# Patient Record
Sex: Male | Born: 1960 | Race: Black or African American | Hispanic: No | Marital: Married | Smoking: Never smoker
Health system: Southern US, Community
[De-identification: ages and names within clinical notes are randomized; demographics above are authoritative.]

## PROBLEM LIST (undated history)

## (undated) DIAGNOSIS — M1990 Unspecified osteoarthritis, unspecified site: Secondary | ICD-10-CM

## (undated) DIAGNOSIS — R011 Cardiac murmur, unspecified: Secondary | ICD-10-CM

## (undated) DIAGNOSIS — K644 Residual hemorrhoidal skin tags: Secondary | ICD-10-CM

## (undated) DIAGNOSIS — I10 Essential (primary) hypertension: Secondary | ICD-10-CM

## (undated) DIAGNOSIS — Z9109 Other allergy status, other than to drugs and biological substances: Secondary | ICD-10-CM

## (undated) HISTORY — DX: Cardiac murmur, unspecified: R01.1

---

## 2002-10-13 ENCOUNTER — Emergency Department (HOSPITAL_COMMUNITY): Admission: EM | Admit: 2002-10-13 | Discharge: 2002-10-13 | Payer: Self-pay | Admitting: Emergency Medicine

## 2002-10-13 ENCOUNTER — Encounter: Payer: Self-pay | Admitting: Emergency Medicine

## 2006-07-10 ENCOUNTER — Ambulatory Visit (HOSPITAL_COMMUNITY): Admission: RE | Admit: 2006-07-10 | Discharge: 2006-07-10 | Payer: Self-pay | Admitting: Orthopedic Surgery

## 2006-07-10 HISTORY — PX: KNEE ARTHROSCOPY W/ MENISCECTOMY: SHX1879

## 2009-08-20 ENCOUNTER — Encounter (INDEPENDENT_AMBULATORY_CARE_PROVIDER_SITE_OTHER): Payer: Self-pay

## 2009-08-20 ENCOUNTER — Inpatient Hospital Stay (HOSPITAL_COMMUNITY): Admission: EM | Admit: 2009-08-20 | Discharge: 2009-09-10 | Payer: Self-pay | Admitting: Emergency Medicine

## 2009-08-20 HISTORY — PX: APPENDECTOMY: SHX54

## 2009-08-28 ENCOUNTER — Encounter (INDEPENDENT_AMBULATORY_CARE_PROVIDER_SITE_OTHER): Payer: Self-pay

## 2009-08-28 HISTORY — PX: OTHER SURGICAL HISTORY: SHX169

## 2010-09-06 LAB — GLUCOSE, CAPILLARY
Glucose-Capillary: 125 mg/dL — ABNORMAL HIGH (ref 70–99)
Glucose-Capillary: 126 mg/dL — ABNORMAL HIGH (ref 70–99)
Glucose-Capillary: 136 mg/dL — ABNORMAL HIGH (ref 70–99)
Glucose-Capillary: 142 mg/dL — ABNORMAL HIGH (ref 70–99)
Glucose-Capillary: 144 mg/dL — ABNORMAL HIGH (ref 70–99)
Glucose-Capillary: 146 mg/dL — ABNORMAL HIGH (ref 70–99)
Glucose-Capillary: 160 mg/dL — ABNORMAL HIGH (ref 70–99)
Glucose-Capillary: 171 mg/dL — ABNORMAL HIGH (ref 70–99)
Glucose-Capillary: 189 mg/dL — ABNORMAL HIGH (ref 70–99)

## 2010-09-06 LAB — CBC
HCT: 32.1 % — ABNORMAL LOW (ref 39.0–52.0)
HCT: 36.1 % — ABNORMAL LOW (ref 39.0–52.0)
HCT: 37 % — ABNORMAL LOW (ref 39.0–52.0)
HCT: 40.1 % (ref 39.0–52.0)
HCT: 45.8 % (ref 39.0–52.0)
Hemoglobin: 10.4 g/dL — ABNORMAL LOW (ref 13.0–17.0)
Hemoglobin: 10.5 g/dL — ABNORMAL LOW (ref 13.0–17.0)
Hemoglobin: 10.7 g/dL — ABNORMAL LOW (ref 13.0–17.0)
Hemoglobin: 10.7 g/dL — ABNORMAL LOW (ref 13.0–17.0)
Hemoglobin: 10.7 g/dL — ABNORMAL LOW (ref 13.0–17.0)
Hemoglobin: 12.4 g/dL — ABNORMAL LOW (ref 13.0–17.0)
Hemoglobin: 13.4 g/dL (ref 13.0–17.0)
Hemoglobin: 15.3 g/dL (ref 13.0–17.0)
MCHC: 33.2 g/dL (ref 30.0–36.0)
MCHC: 33.3 g/dL (ref 30.0–36.0)
MCHC: 33.5 g/dL (ref 30.0–36.0)
MCHC: 33.6 g/dL (ref 30.0–36.0)
MCHC: 33.7 g/dL (ref 30.0–36.0)
MCHC: 33.8 g/dL (ref 30.0–36.0)
MCHC: 33.8 g/dL (ref 30.0–36.0)
MCHC: 34 g/dL (ref 30.0–36.0)
MCHC: 34.2 g/dL (ref 30.0–36.0)
MCHC: 34.5 g/dL (ref 30.0–36.0)
MCV: 87.1 fL (ref 78.0–100.0)
MCV: 88.1 fL (ref 78.0–100.0)
MCV: 88.2 fL (ref 78.0–100.0)
MCV: 88.3 fL (ref 78.0–100.0)
MCV: 88.7 fL (ref 78.0–100.0)
MCV: 89 fL (ref 78.0–100.0)
MCV: 89.2 fL (ref 78.0–100.0)
MCV: 89.2 fL (ref 78.0–100.0)
Platelets: 320 10*3/uL (ref 150–400)
Platelets: 324 10*3/uL (ref 150–400)
Platelets: 343 10*3/uL (ref 150–400)
Platelets: 349 10*3/uL (ref 150–400)
Platelets: 435 10*3/uL — ABNORMAL HIGH (ref 150–400)
Platelets: 542 10*3/uL — ABNORMAL HIGH (ref 150–400)
Platelets: 591 10*3/uL — ABNORMAL HIGH (ref 150–400)
Platelets: 609 10*3/uL — ABNORMAL HIGH (ref 150–400)
Platelets: 639 10*3/uL — ABNORMAL HIGH (ref 150–400)
RBC: 3.29 MIL/uL — ABNORMAL LOW (ref 4.22–5.81)
RBC: 3.49 MIL/uL — ABNORMAL LOW (ref 4.22–5.81)
RBC: 3.56 MIL/uL — ABNORMAL LOW (ref 4.22–5.81)
RBC: 4.17 MIL/uL — ABNORMAL LOW (ref 4.22–5.81)
RBC: 4.58 MIL/uL (ref 4.22–5.81)
RBC: 5.2 MIL/uL (ref 4.22–5.81)
RDW: 15.5 % (ref 11.5–15.5)
RDW: 15.8 % — ABNORMAL HIGH (ref 11.5–15.5)
RDW: 15.9 % — ABNORMAL HIGH (ref 11.5–15.5)
RDW: 16 % — ABNORMAL HIGH (ref 11.5–15.5)
RDW: 16.2 % — ABNORMAL HIGH (ref 11.5–15.5)
RDW: 16.2 % — ABNORMAL HIGH (ref 11.5–15.5)
RDW: 16.3 % — ABNORMAL HIGH (ref 11.5–15.5)
RDW: 16.3 % — ABNORMAL HIGH (ref 11.5–15.5)
RDW: 16.5 % — ABNORMAL HIGH (ref 11.5–15.5)
WBC: 10.6 10*3/uL — ABNORMAL HIGH (ref 4.0–10.5)
WBC: 10.9 10*3/uL — ABNORMAL HIGH (ref 4.0–10.5)
WBC: 11.2 10*3/uL — ABNORMAL HIGH (ref 4.0–10.5)
WBC: 11.4 10*3/uL — ABNORMAL HIGH (ref 4.0–10.5)
WBC: 7.8 10*3/uL (ref 4.0–10.5)
WBC: 9.6 10*3/uL (ref 4.0–10.5)

## 2010-09-06 LAB — COMPREHENSIVE METABOLIC PANEL
ALT: 22 U/L (ref 0–53)
AST: 24 U/L (ref 0–37)
AST: 37 U/L (ref 0–37)
AST: 49 U/L — ABNORMAL HIGH (ref 0–37)
Albumin: 2 g/dL — ABNORMAL LOW (ref 3.5–5.2)
Albumin: 2.3 g/dL — ABNORMAL LOW (ref 3.5–5.2)
Alkaline Phosphatase: 55 U/L (ref 39–117)
BUN: 12 mg/dL (ref 6–23)
BUN: 8 mg/dL (ref 6–23)
CO2: 23 mEq/L (ref 19–32)
Calcium: 7.7 mg/dL — ABNORMAL LOW (ref 8.4–10.5)
Calcium: 9.9 mg/dL (ref 8.4–10.5)
Chloride: 101 mEq/L (ref 96–112)
Chloride: 110 mEq/L (ref 96–112)
Creatinine, Ser: 0.75 mg/dL (ref 0.4–1.5)
Creatinine, Ser: 0.82 mg/dL (ref 0.4–1.5)
Creatinine, Ser: 1.1 mg/dL (ref 0.4–1.5)
Creatinine, Ser: 1.14 mg/dL (ref 0.4–1.5)
GFR calc Af Amer: >60 mL/min (ref >60)
GFR calc Af Amer: >60 mL/min (ref >60)
GFR calc non Af Amer: >60 mL/min (ref >60)
GFR calc non Af Amer: >60 mL/min (ref >60)
Glucose, Bld: 112 mg/dL — ABNORMAL HIGH (ref 70–99)
Potassium: 3.9 mEq/L (ref 3.5–5.1)
Potassium: 4.4 mEq/L (ref 3.5–5.1)
Total Bilirubin: 0.5 mg/dL (ref 0.3–1.2)
Total Bilirubin: 0.8 mg/dL (ref 0.3–1.2)
Total Protein: 5.4 g/dL — ABNORMAL LOW (ref 6.0–8.3)
Total Protein: 6.7 g/dL (ref 6.0–8.3)
Total Protein: 7.6 g/dL (ref 6.0–8.3)

## 2010-09-06 LAB — TYPE AND SCREEN
ABO/RH(D): A NEG
Antibody Screen: NEGATIVE

## 2010-09-06 LAB — BASIC METABOLIC PANEL
BUN: 10 mg/dL (ref 6–23)
BUN: 10 mg/dL (ref 6–23)
BUN: 15 mg/dL (ref 6–23)
BUN: 7 mg/dL (ref 6–23)
BUN: 9 mg/dL (ref 6–23)
CO2: 22 mEq/L (ref 19–32)
CO2: 24 mEq/L (ref 19–32)
CO2: 24 mEq/L (ref 19–32)
CO2: 25 mEq/L (ref 19–32)
CO2: 29 mEq/L (ref 19–32)
CO2: 32 mEq/L (ref 19–32)
Calcium: 7.6 mg/dL — ABNORMAL LOW (ref 8.4–10.5)
Calcium: 7.9 mg/dL — ABNORMAL LOW (ref 8.4–10.5)
Calcium: 8.8 mg/dL (ref 8.4–10.5)
Calcium: 8.8 mg/dL (ref 8.4–10.5)
Calcium: 9 mg/dL (ref 8.4–10.5)
Chloride: 106 mEq/L (ref 96–112)
Chloride: 108 mEq/L (ref 96–112)
Chloride: 96 mEq/L (ref 96–112)
Chloride: 97 mEq/L (ref 96–112)
Creatinine, Ser: 0.7 mg/dL (ref 0.4–1.5)
Creatinine, Ser: 0.95 mg/dL (ref 0.4–1.5)
Creatinine, Ser: 1.14 mg/dL (ref 0.4–1.5)
Creatinine, Ser: 1.14 mg/dL (ref 0.4–1.5)
Creatinine, Ser: 1.14 mg/dL (ref 0.4–1.5)
Creatinine, Ser: 1.15 mg/dL (ref 0.4–1.5)
Creatinine, Ser: 1.22 mg/dL (ref 0.4–1.5)
GFR calc Af Amer: >60 mL/min (ref >60)
GFR calc Af Amer: >60 mL/min (ref >60)
GFR calc Af Amer: >60 mL/min (ref >60)
GFR calc Af Amer: >60 mL/min (ref >60)
GFR calc Af Amer: >60 mL/min (ref >60)
GFR calc Af Amer: >60 mL/min (ref >60)
GFR calc non Af Amer: >60 mL/min (ref >60)
GFR calc non Af Amer: >60 mL/min (ref >60)
GFR calc non Af Amer: >60 mL/min (ref >60)
GFR calc non Af Amer: >60 mL/min (ref >60)
GFR calc non Af Amer: >60 mL/min (ref >60)
GFR calc non Af Amer: >60 mL/min (ref >60)
Glucose, Bld: 131 mg/dL — ABNORMAL HIGH (ref 70–99)
Glucose, Bld: 132 mg/dL — ABNORMAL HIGH (ref 70–99)
Glucose, Bld: 145 mg/dL — ABNORMAL HIGH (ref 70–99)
Glucose, Bld: 149 mg/dL — ABNORMAL HIGH (ref 70–99)
Potassium: 3.1 mEq/L — ABNORMAL LOW (ref 3.5–5.1)
Potassium: 3.8 mEq/L (ref 3.5–5.1)
Potassium: 3.8 mEq/L (ref 3.5–5.1)
Potassium: 3.9 mEq/L (ref 3.5–5.1)
Sodium: 136 mEq/L (ref 135–145)
Sodium: 137 mEq/L (ref 135–145)
Sodium: 140 mEq/L (ref 135–145)
Sodium: 141 mEq/L (ref 135–145)
Sodium: 143 mEq/L (ref 135–145)

## 2010-09-06 LAB — DIFFERENTIAL
Basophils Absolute: 0 10*3/uL (ref 0.0–0.1)
Basophils Absolute: 0 10*3/uL (ref 0.0–0.1)
Basophils Relative: 0 % (ref 0–1)
Eosinophils Absolute: 0 10*3/uL (ref 0.0–0.7)
Eosinophils Relative: 1 % (ref 0–5)
Eosinophils Relative: 1 % (ref 0–5)
Eosinophils Relative: 2 % (ref 0–5)
Lymphocytes Relative: 16 % (ref 12–46)
Lymphocytes Relative: 5 % — ABNORMAL LOW (ref 12–46)
Lymphocytes Relative: 8 % — ABNORMAL LOW (ref 12–46)
Lymphs Abs: 0.6 10*3/uL — ABNORMAL LOW (ref 0.7–4.0)
Lymphs Abs: 1.3 10*3/uL (ref 0.7–4.0)
Monocytes Absolute: 0.5 10*3/uL (ref 0.1–1.0)
Monocytes Absolute: 0.9 10*3/uL (ref 0.1–1.0)
Monocytes Relative: 9 % (ref 3–12)
Neutro Abs: 8.6 10*3/uL — ABNORMAL HIGH (ref 1.7–7.7)

## 2010-09-06 LAB — URINALYSIS, ROUTINE W REFLEX MICROSCOPIC
Bilirubin Urine: NEGATIVE
Bilirubin Urine: NEGATIVE
Hgb urine dipstick: NEGATIVE
Ketones, ur: 15 mg/dL — AB
Ketones, ur: NEGATIVE mg/dL
Leukocytes, UA: NEGATIVE
Nitrite: NEGATIVE
Nitrite: NEGATIVE
Protein, ur: NEGATIVE mg/dL
Specific Gravity, Urine: 1.03 (ref 1.005–1.030)
Urobilinogen, UA: 1 mg/dL (ref 0.0–1.0)
pH: 7 (ref 5.0–8.0)

## 2010-09-06 LAB — PHOSPHORUS
Phosphorus: 3.2 mg/dL (ref 2.3–4.6)
Phosphorus: 3.7 mg/dL (ref 2.3–4.6)

## 2010-09-06 LAB — URINE CULTURE: Culture: NO GROWTH

## 2010-09-06 LAB — ANAEROBIC CULTURE

## 2010-09-06 LAB — TRIGLYCERIDES: Triglycerides: 106 mg/dL (ref <150)

## 2010-09-06 LAB — CLOSTRIDIUM DIFFICILE EIA: C difficile Toxins A+B, EIA: NEGATIVE

## 2010-09-06 LAB — CHOLESTEROL, TOTAL: Cholesterol: 57 mg/dL (ref 0–200)

## 2010-09-06 LAB — CULTURE, ROUTINE-ABSCESS

## 2010-09-06 LAB — MAGNESIUM
Magnesium: 2.1 mg/dL (ref 1.5–2.5)
Magnesium: 2.4 mg/dL (ref 1.5–2.5)
Magnesium: 2.6 mg/dL — ABNORMAL HIGH (ref 1.5–2.5)

## 2010-09-06 LAB — LIPASE, BLOOD: Lipase: 29 U/L (ref 11–59)

## 2010-10-29 NOTE — Op Note (Signed)
NAME:  [name] [name], [name] [name]           ACCOUNT NO.:  [id number]   MEDICAL RECORD NO.:  [id number]          PATIENT TYPE:  AMB   LOCATION:  DAY                          FACILITY:  [facility]   PHYSICIAN:  Ollen Gross, M.D.    DATE OF BIRTH:  1960-10-17   DATE OF PROCEDURE:  07/10/2006  DATE OF DISCHARGE:                               OPERATIVE REPORT   PREOPERATIVE DIAGNOSIS:  Right knee medial meniscal tear.   POSTOPERATIVE DIAGNOSIS:  Right knee medial meniscal tear.   PROCEDURE:  Right knee arthroscopy with medial meniscal debridement.   SURGEON:  Ollen Gross, M.D.   ASSISTANT:  No assistant.   ANESTHESIA:  General.   BLOOD LOSS:  Minimal.   DRAIN:  None.   COMPLICATIONS:  None.   CONDITION:  Stable to Recovery.   BRIEF CLINICAL NOTE:  [name] is a 50 year old male with several-month  history of right knee pain and mechanical symptoms.  Exam and history  suggest a medial meniscal tear.  MRI confirmed it.  He presents now for  arthroscopy and debridement.   PROCEDURE IN DETAIL:  After the successful administration of general  anesthetic, a tourniquet was placed high on the right thigh and the  right lower extremity prepped and draped in the usual sterile fashion.  Standard superomedial and inferolateral incisions were made, inflow  cannula passed superomedial and camera passed inferolateral.  Arthroscopic visualization proceeds.  Undersurface of the patella and  trochlea looked normal.  There is a tiny defect in the central inferior  aspect of the trochlea.  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  Medial and lateral gutters  are visualized; there are no loose bodies.  Flexion and valgus force  were applied to the knee and the medial compartment is entered.  There  is evidence of any significant tear involving entire posterior horn of  the medial meniscus.  It is displaced.  The chondral surfaces show some  low-grade  chondromalacia of the medial femoral condyle, otherwise  normal.  Spinal needle is used to localize the inferomedial portal and a  small incision made, dilator placed and then we debrided the meniscus  back to a stable base with baskets and a 4.2-mm shaver.  We sealed off  the edges with the ArthroCare device.  It is a stable meniscus at this  time, removing only about 30% of the posterior horn.  The intercondylar  notch is visualized; ACL was normal, lateral compartments also normal.  We inspected the joint again, no further tears or loose bodies noted.  The arthroscopic equipment was then removed from the inferior portals,  which were closed with interrupted 4-0 nylon.  Twenty milliliters of 0.25% Marcaine with epinephrine were injected  through the inflow cannula, then that is removed and that portal closed  with nylon.  Bulky sterile dressings are applied and he is awakened and  transported to Recovery in stable condition.      Ollen Gross, M.D.  Electronically Signed     FA/MEDQ  D:  07/10/2006  T:  07/11/2006  Job:  [id number]

## 2011-04-01 IMAGING — CT CT ABD-PELV W/ CM
2 of 5 series · 16 of 46 positions shown, 18 images · IV contrast (agent unspecified)
Comparison: CT 08/27/2009 and CT 08/20/2009.

Addendum Begins

I discussed the case further with Dr. Devonta  on 09/02/2009 at 2740
hours.  He  informed me  the patient had a  repeat operation on
08/28/2009   where additional bowel resection of the terminal ileum
was performed.  The patient had peritonitis at that time.  This may
explain the small amount of free air present in the peritoneum.
This also explain the gas bubbles in the surgical wound in  the
right abdomen.
However, there is a large amount of fluid in the peroneal cavity
which is concerning for infection given the time sequence.  There
is a loculated pocket of fluid just below the hepatic flexure
measuring 3.6 cm which is concerning for abscess.  The other fluid
collections in the abdomen and between loops of small bowel could
also be infected.  There is a superficial pocket of fluid in the
right lower quadrant which could be sampled to determine if it is
infected.
Addendum Ends
CLINICAL DATA: Appendectomy.  Fever and leukocytosis.  Distended
abdomen.
CT ABDOMEN AND PELVIS WITH CONTRAST
TECHNIQUE: Multidetector CT imaging of the abdomen and pelvis was
performed following the standard protocol during bolus
administration of intravenous contrast.
Contrast: 100 ml Wmnipaque-UBB IV

[Series 2: routine abdomen · axial · 0.68mm/px · z∈[-456,-41]mm · 13 of 93 slices shown, 15 images]
[im 5/93  soft-tissue]
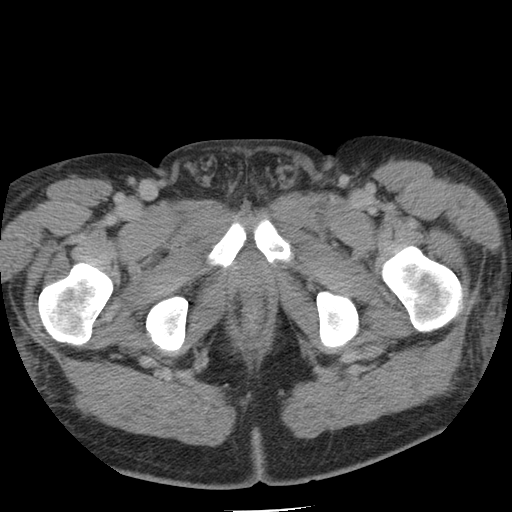
[im 5/93  bone]
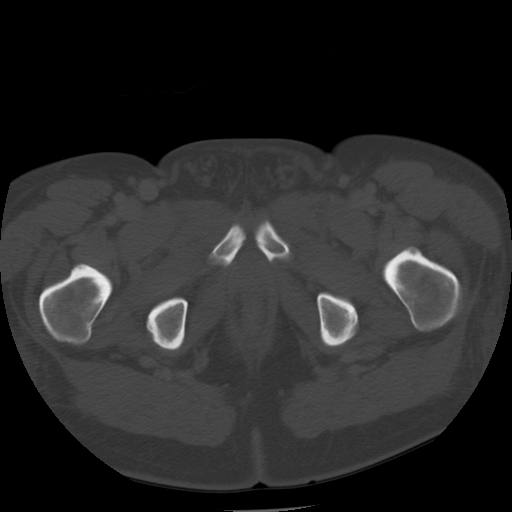
[im 15/93  soft-tissue]
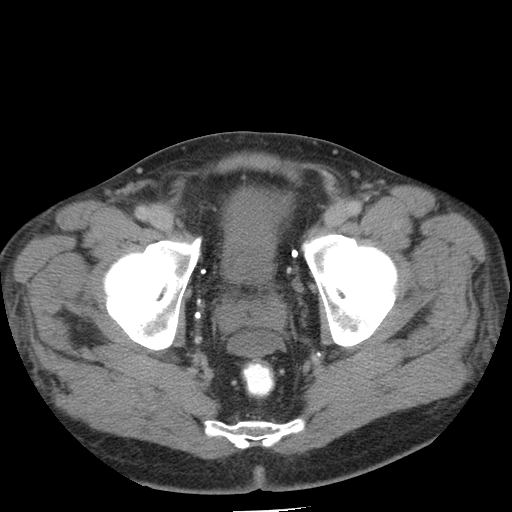
[im 20/93  soft-tissue]
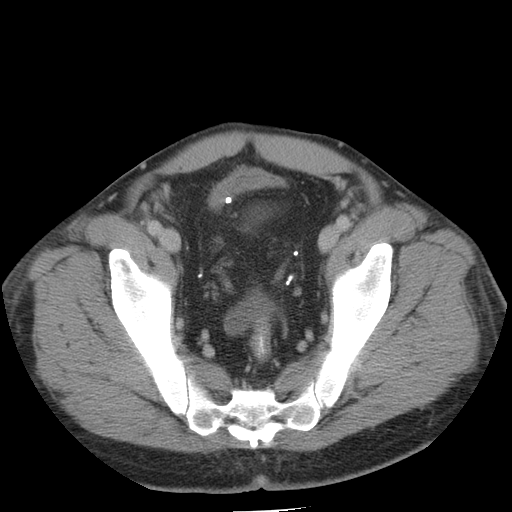
[im 25/93  soft-tissue]
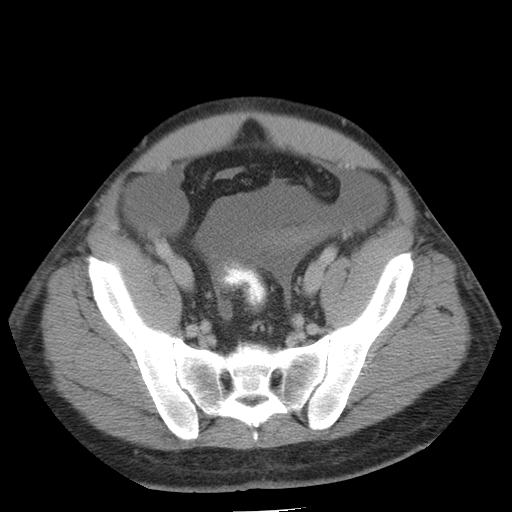
[im 34/93  soft-tissue]
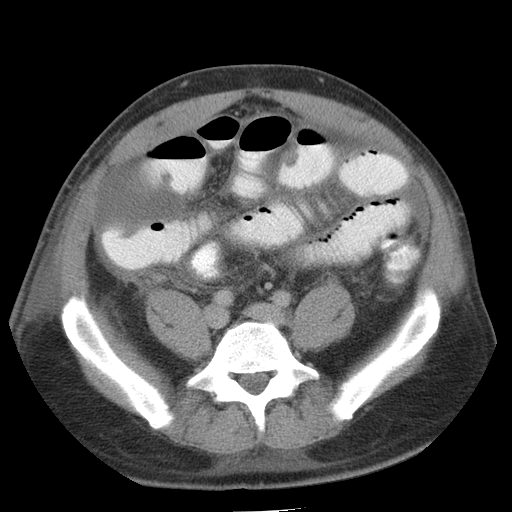
[im 39/93  soft-tissue]
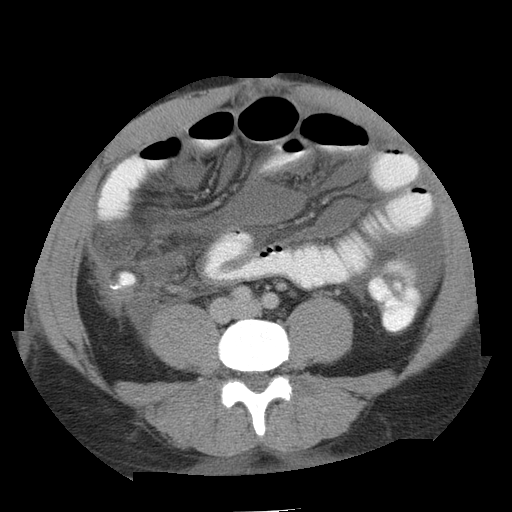
[im 49/93  soft-tissue]
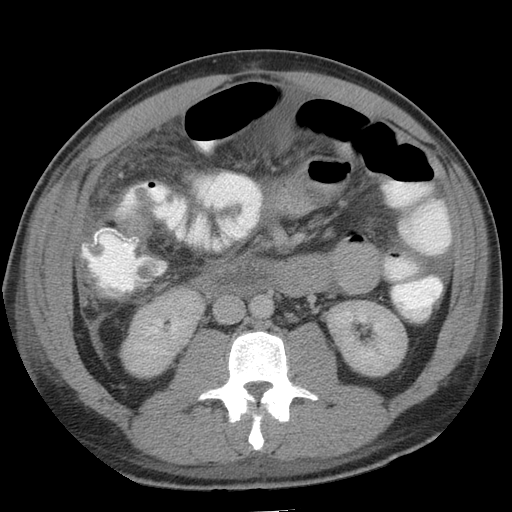
[im 54/93  soft-tissue]
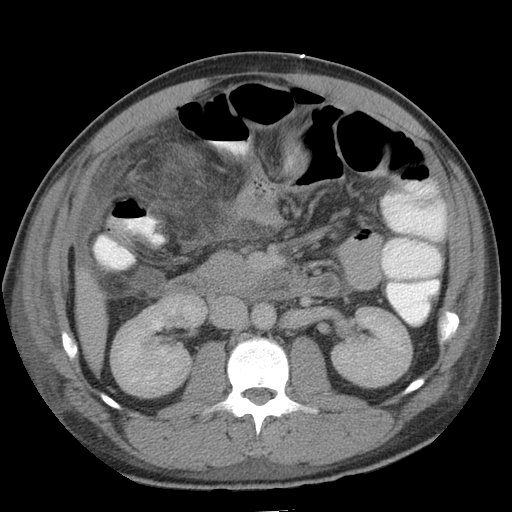
[im 59/93  soft-tissue]
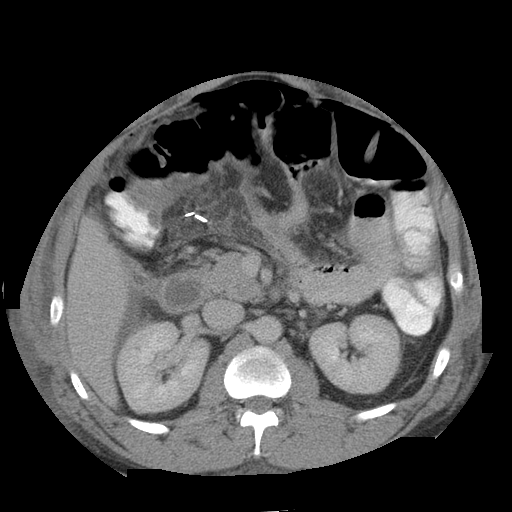
[im 59/93  bone]
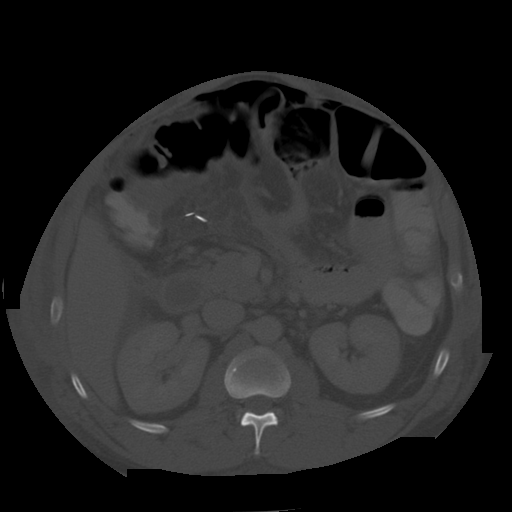
[im 68/93  soft-tissue]
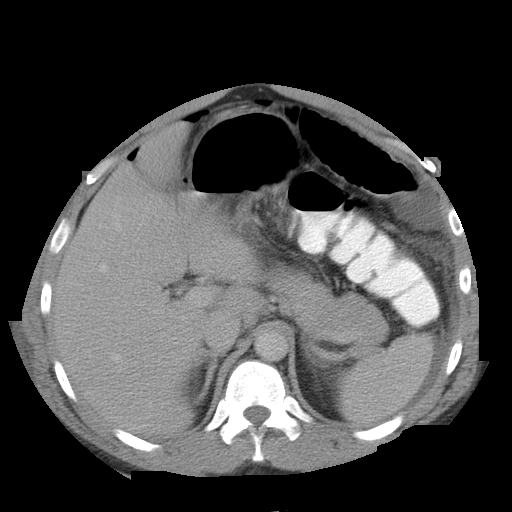
[im 73/93  soft-tissue]
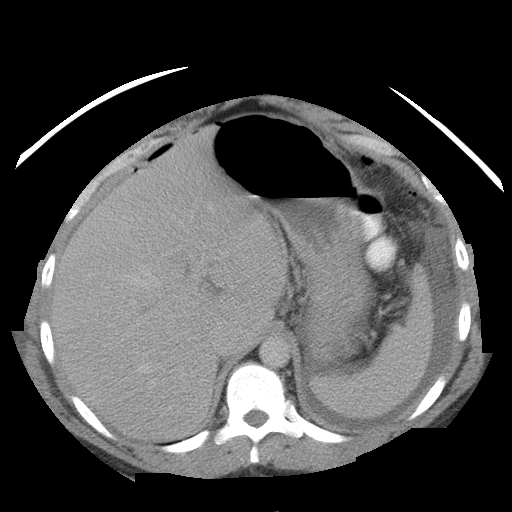
[im 78/93  soft-tissue]
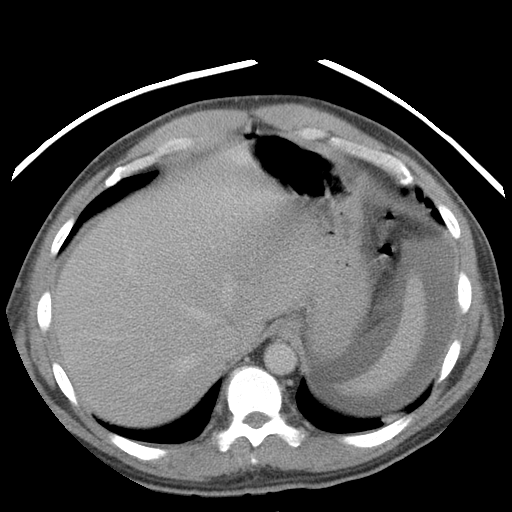
[im 88/93  soft-tissue]
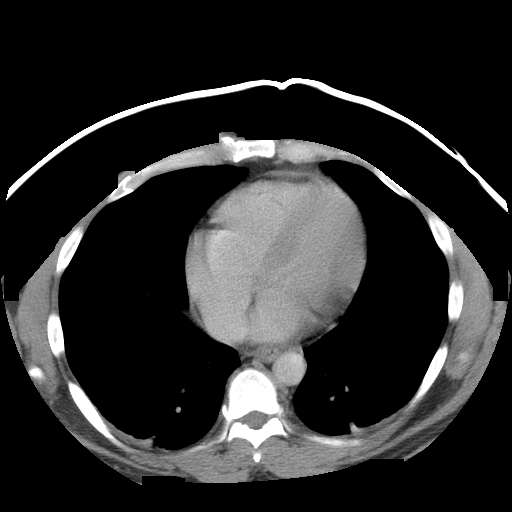

[Series 104: coronal · coronal · 0.90mm/px · 3 of 114 slices shown]
[im 38/114  soft-tissue]
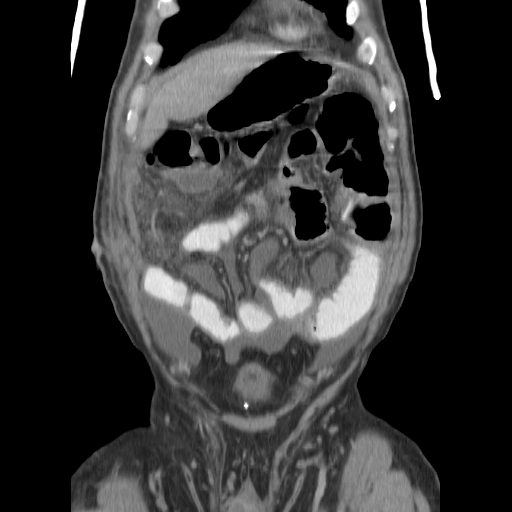
[im 51/114  soft-tissue]
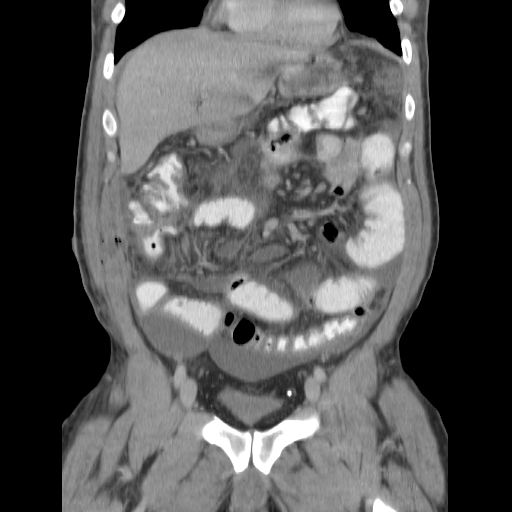
[im 63/114  soft-tissue]
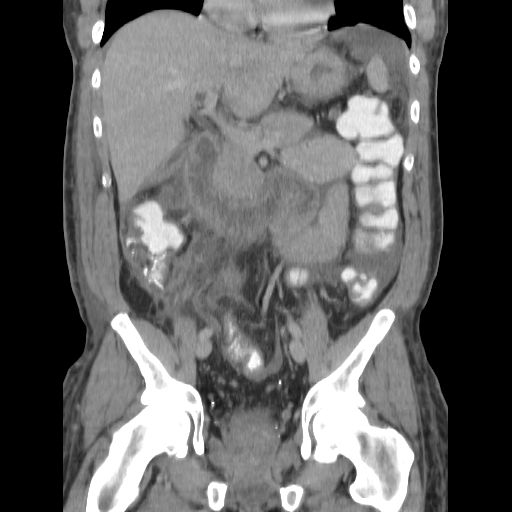

[16 of 46 positions shown; findings below may reference images not displayed]

FINDINGS: Since the prior study, the patient has developed free
intraperitoneal air.  There is a large amount of peritoneal fluid.
There is fluid surrounding the spleen and a small amount of fluid
around the liver.  There are multiple loculations of fluid in the
pelvis as well as multiple interloop fluid collections in the
mesentery.  These are concerning for abscess.  Given the free air,
bowel perforation is likely present.  No contrast leak into the
peritoneal cavity is present.

There has been appendectomy.  There are clips around the cecum.
There is thickening of the cecum which is likely due to infection.
The small bowel is filled with contrast and is diffusely dilated .
The colon is filled with contrast and is mildly distended as well.
The findings may be due to small bowel obstruction versus ileus.
There is edema in the sigmoid colon which may be due to peroneal
infection.

There is a surgical incision in the right abdomen.  This shows
increased edema and now has multiple gas bubbles suggesting wound
infection.  This has progressed in the interval.

The liver and spleen show no focal abnormality.  Negative for
hydronephrosis.  The pancreas shows no focal abnormality.
IMPRESSION: Since the prior CT, the patient has developed free intraperitoneal
air and a large amount of intraperitoneal fluid.  There are
multiple loculated collections in the pelvis and between loops of
bowel in the  mesentery suggesting abscess.  The findings are most
compatible with bowel perforation.

Critical test results telephoned to Dr. Ibis at the time of
interpretation on 09/02/2009 at 9099 hours.

## 2011-04-02 IMAGING — US US ABSCESS DRAINAGE W/ CATHETER
1 series · 4 of 4 positions shown · non-contrast
Comparison: none

CLINICAL HISTORY: 48-year-old status post abdominal surgery for
appendicitis.

[Series 1: us abscess drainage w/ catheter · 0.26mm/px · 4 of 4 slices shown]
[im 1/4]
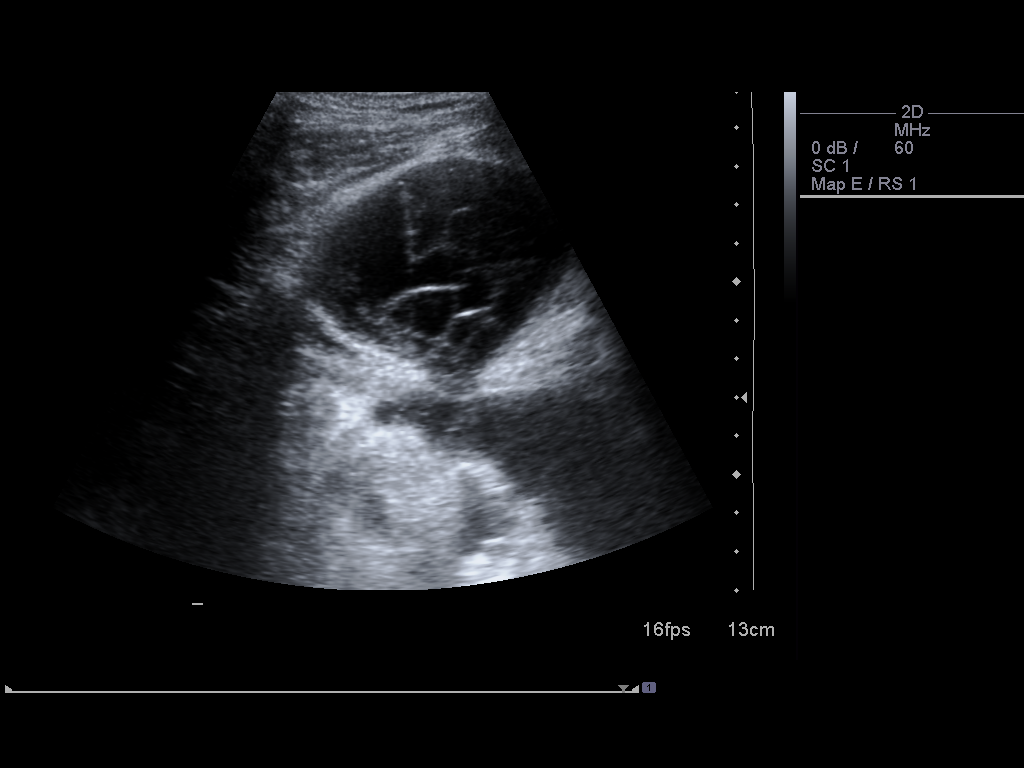
[im 2/4]
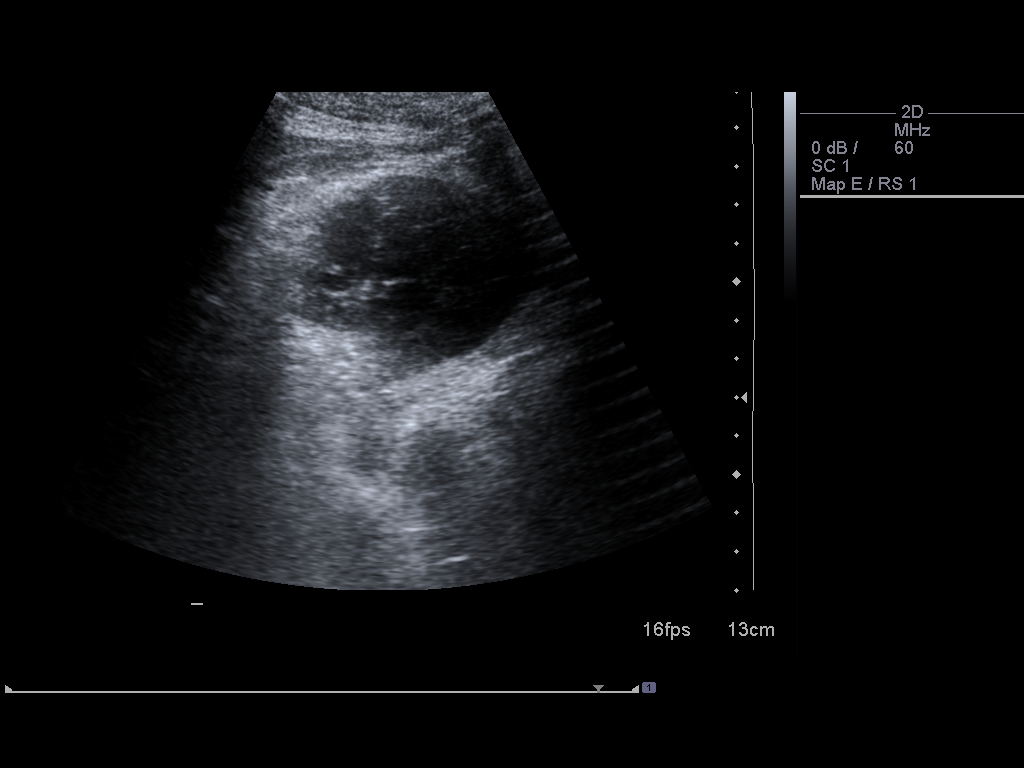
[im 3/4]
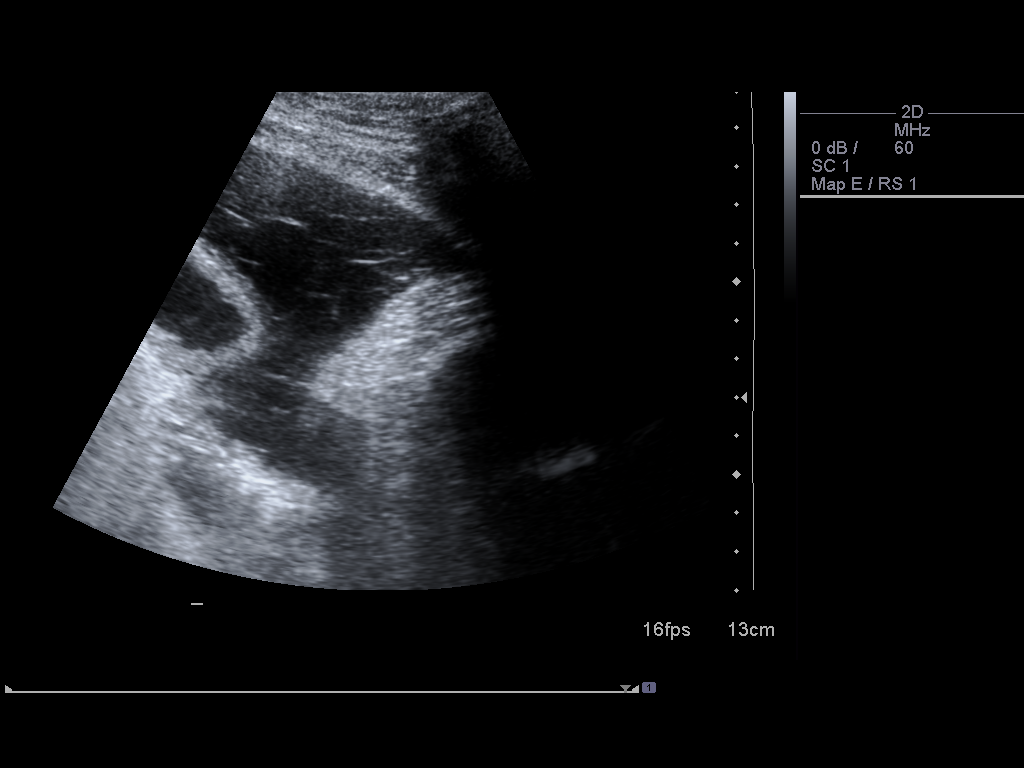
[im 4/4]
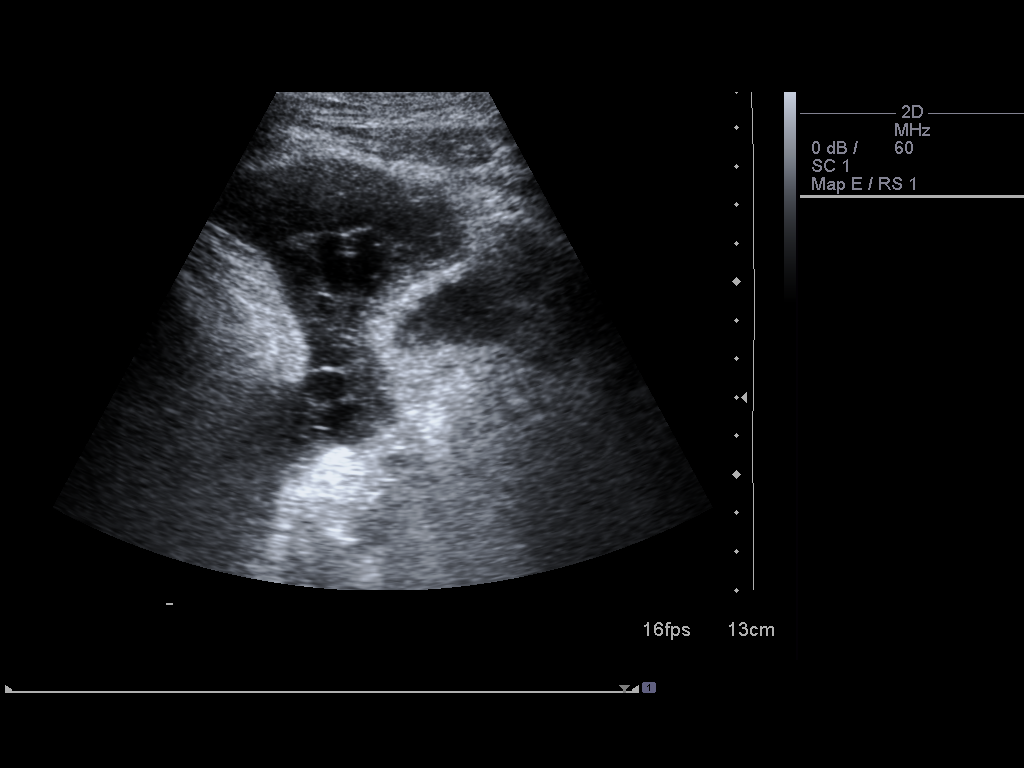

[4 of 4 positions shown; findings below may reference images not displayed]

PROCEDURE(S): ULTRASOUND GUIDED PARACENTESIS

Medications:None

Moderate sedation time:None

Fluoroscopy time: None

Procedure:The procedure was explained to the patient.  The risks
and benefits of the procedure were discussed and the patient's
questions were addressed.  Informed consent was obtained from the
patient. Fluid pocket was targeted in the right lower abdomen below
the patient's surgical staples.  The skin was prepped and draped in
a sterile fashion.  The skin was anesthetized with 1% lidocaine.  A
19 gauge Yueh needle was directed into the loculated fluid pocket
with ultrasound guidance.  50 ml of cloudy yellow fluid was
removed.  Needle was removed without complication.
FINDINGS: Complex loculated fluid collection in the right lower
quadrant below the patient's surgical skin staples.
IMPRESSION: Ultrasound guided paracentesis from a right lower
quadrant loculated fluid pocket.  50 ml of cloudy yellow fluid was
removed and sent for analysis.

## 2012-11-16 ENCOUNTER — Telehealth: Payer: Self-pay | Admitting: Gastroenterology

## 2012-11-16 NOTE — Telephone Encounter (Signed)
Patient called requesting to see Dr. Christella Hartigan. He has Gi hx saw someone 7 weeks ago per pt. His Gi moved to [place], therefore he is trying to find someone in [place]. He will have records sent to be reviewed.

## 2013-02-22 ENCOUNTER — Encounter: Payer: Self-pay | Admitting: Gastroenterology

## 2013-03-27 ENCOUNTER — Other Ambulatory Visit (INDEPENDENT_AMBULATORY_CARE_PROVIDER_SITE_OTHER): Payer: BC Managed Care – PPO

## 2013-03-27 ENCOUNTER — Ambulatory Visit (INDEPENDENT_AMBULATORY_CARE_PROVIDER_SITE_OTHER): Payer: BC Managed Care – PPO | Admitting: Gastroenterology

## 2013-03-27 ENCOUNTER — Encounter: Payer: Self-pay | Admitting: Gastroenterology

## 2013-03-27 VITALS — BP 132/94 | HR 72 | Ht 68.0 in | Wt 188.4 lb

## 2013-03-27 DIAGNOSIS — R195 Other fecal abnormalities: Secondary | ICD-10-CM

## 2013-03-27 LAB — COMPREHENSIVE METABOLIC PANEL
Albumin: 4.5 g/dL (ref 3.5–5.2)
BUN: 17 mg/dL (ref 6–23)
Calcium: 9.5 mg/dL (ref 8.4–10.5)
Chloride: 103 mEq/L (ref 96–112)
Creatinine, Ser: 1.1 mg/dL (ref 0.4–1.5)
Glucose, Bld: 89 mg/dL (ref 70–99)
Potassium: 4 mEq/L (ref 3.5–5.1)

## 2013-03-27 LAB — CBC WITH DIFFERENTIAL/PLATELET
Basophils Absolute: 0 10*3/uL (ref 0.0–0.1)
Basophils Relative: 0.5 % (ref 0.0–3.0)
Eosinophils Absolute: 0.1 10*3/uL (ref 0.0–0.7)
Eosinophils Relative: 4.5 % (ref 0.0–5.0)
HCT: 47.1 % (ref 39.0–52.0)
Hemoglobin: 15.8 g/dL (ref 13.0–17.0)
Lymphocytes Relative: 34.8 % (ref 12.0–46.0)
Lymphs Abs: 1 10*3/uL (ref 0.7–4.0)
MCHC: 33.6 g/dL (ref 30.0–36.0)
MCV: 86.2 fl (ref 78.0–100.0)
Monocytes Absolute: 0.3 10*3/uL (ref 0.1–1.0)
Monocytes Relative: 10.7 % (ref 3.0–12.0)
Neutro Abs: 1.5 10*3/uL (ref 1.4–7.7)
Neutrophils Relative %: 49.5 % (ref 43.0–77.0)
Platelets: 189 10*3/uL (ref 150.0–400.0)
RBC: 5.46 Mil/uL (ref 4.22–5.81)
RDW: 14.8 % — ABNORMAL HIGH (ref 11.5–14.6)
WBC: 3 10*3/uL — ABNORMAL LOW (ref 4.5–10.5)

## 2013-03-27 LAB — TSH: TSH: 2.07 u[IU]/mL (ref 0.35–5.50)

## 2013-03-27 LAB — SEDIMENTATION RATE: Sed Rate: 8 mm/hr (ref 0–22)

## 2013-03-27 MED ORDER — CHOLESTYRAMINE 4 G PO PACK: 1.0000 | PACK | Freq: Every day | ORAL | Status: DC

## 2013-03-27 MED ORDER — MOVIPREP 100 G PO SOLR: 1.0000 | Freq: Once | ORAL | Status: DC

## 2013-03-27 NOTE — Progress Notes (Signed)
HPI: This is a    very pleasant 52 year old man whom I am meeting for the first time today.  Appendectomy in 2011, this subsequently developed an area of necrosis and perforation which required ileocecectomy March, 2011.  Dr. Bertram Savin.  Ever since that surgery he's had loose stools, usually three times per day.  + intermittent nocturnal symptoms as well.  No blood in his stool.  He had colonoscopy 13-14 years ago; done for life insurance purposes.    No colon cancer in family.  His son may have UC (take injections every 6 weeks).  Has spasm pains intermittently, cannot say how often. Usually with straining or exerting himself.  He is bothered by a small incisional hernia, does not sound like he has instructions however.   Review of systems: Pertinent positive and negative review of systems were noted in the above HPI section. Complete review of systems was performed and was otherwise normal.    Past Medical History  Diagnosis Date  . HTN (hypertension)     Past Surgical History  Procedure Laterality Date  . Colon resection    . Appendectomy    . Meniscus repair Bilateral     No current outpatient prescriptions on file.   No current facility-administered medications for this visit.    Allergies as of 03/27/2013  . (No Known Allergies)    Family History  Problem Relation Age of Onset  . Diabetes Father   . Hypertension Father     History   Social History  . Marital Status: Married    Spouse Name: N/A    Number of Children: 3  . Years of Education: N/A   Occupational History  . 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 Smoking status: Never Smoker   . Smokeless tobacco: Never Used  . Alcohol Use: Yes     Comment: 12 beers on the weekends  . Drug Use: No  . Sexual Activity: Not on file   Other Topics Concern  . Not on file   Social History Narrative  . No narrative on file       Physical Exam: BP 132/94  Pulse 72  Ht 5\' 8"   (1.727 m)  Wt 188 lb 6 oz (85.446 kg)  BMI 28.65 kg/m2 Constitutional: generally well-appearing Psychiatric: alert and oriented x3 Eyes: extraocular movements intact Mouth: oral pharynx moist, no lesions Neck: supple no lymphadenopathy Cardiovascular: heart regular rate and rhythm Lungs: clear to auscultation bilaterally Abdomen: soft, nontender, nondistended, no obvious ascites, no peritoneal signs, normal bowel sounds, right-sided appendectomy scar which is quite longer than usual and there is an obvious small hernia within the scar.  Extremities: no lower extremity edema bilaterally Skin: no lesions on visible extremities    Assessment and plan: 52 y.o. male with  loose stools since appendectomy, ileocecectomy in 2011  His colonoscopy rule out neoplastic causes, his son also has what sounds like colitis inactive be going on here. 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 I'm going to start him on cholestyramine powder 4 g once daily with the idea that if this is not helpful I can titrate him up to twice daily or even 3 times daily. We'll proceed with colonoscopy at his soonest convenience as well. He is going to get a basic set of labs including CBC, complete metabolic profile, thyroid testing and sedimentation rate.

## 2013-03-27 NOTE — Patient Instructions (Signed)
You will be set up for a colonoscopy for loose stools (LEC, moderate sedation). New prescription for cholestyramine powder called into your pharmacy (take one packet once daily). You will have labs checked today in the basement lab.  Please head down after you check out with the front desk  (cbc, cmet, tsh, esr).

## 2013-03-29 ENCOUNTER — Encounter: Payer: Self-pay | Admitting: Gastroenterology

## 2013-04-09 ENCOUNTER — Ambulatory Visit (AMBULATORY_SURGERY_CENTER): Payer: BC Managed Care – PPO | Admitting: Gastroenterology

## 2013-04-09 ENCOUNTER — Encounter: Payer: Self-pay | Admitting: Gastroenterology

## 2013-04-09 VITALS — BP 140/111 | HR 82 | Temp 97.4°F | Resp 14 | Ht 68.0 in | Wt 188.0 lb

## 2013-04-09 DIAGNOSIS — R195 Other fecal abnormalities: Secondary | ICD-10-CM

## 2013-04-09 DIAGNOSIS — Z1211 Encounter for screening for malignant neoplasm of colon: Secondary | ICD-10-CM

## 2013-04-09 MED ORDER — SODIUM CHLORIDE 0.9 % IV SOLN: 500.0000 mL | INTRAVENOUS | Status: DC

## 2013-04-09 NOTE — Progress Notes (Signed)
Patient did not experience any of the following events: a burn prior to discharge; a fall within the facility; wrong site/side/patient/procedure/implant event; or a hospital transfer or hospital admission upon discharge from the facility. (G8907) Patient did not have preoperative order for IV antibiotic SSI prophylaxis. (G8918)  

## 2013-04-09 NOTE — Progress Notes (Signed)
Dr. Christella Hartigan made aware of elevated B/P prior to sedation.

## 2013-04-09 NOTE — Patient Instructions (Signed)
YOU HAD AN ENDOSCOPIC PROCEDURE TODAY AT THE [facility]: Refer to the procedure report that was given to you for any specific questions about what was found during the examination.  If the procedure report does not answer your questions, please call your gastroenterologist to clarify.  If you requested that your care partner not be given the details of your procedure findings, then the procedure report has been included in a sealed envelope for you to review at your convenience later.  YOU SHOULD EXPECT: Some feelings of bloating in the abdomen. Passage of more gas than usual.  Walking can help get rid of the air that was put into your GI tract during the procedure and reduce the bloating. If you had a lower endoscopy (such as a colonoscopy or flexible sigmoidoscopy) you may notice spotting of blood in your stool or on the toilet paper. If you underwent a bowel prep for your procedure, then you may not have a normal bowel movement for a few days.  DIET: Your first meal following the procedure should be a light meal and then it is ok to progress to your normal diet.  A half-sandwich or bowl of soup is an example of a good first meal.  Heavy or fried foods are harder to digest and may make you feel nauseous or bloated.  Likewise meals heavy in dairy and vegetables can cause extra gas to form and this can also increase the bloating.  Drink plenty of fluids but you should avoid alcoholic beverages for 24 hours.  ACTIVITY: Your care partner should take you home directly after the procedure.  You should plan to take it easy, moving slowly for the rest of the day.  You can resume normal activity the day after the procedure however you should NOT DRIVE or use heavy machinery for 24 hours (because of the sedation medicines used during the test).    SYMPTOMS TO REPORT IMMEDIATELY: A gastroenterologist can be reached at any hour.  During normal business hours, 8:30 AM to 5:00 PM Monday through Friday,  call [phone].  After hours and on weekends, please call the GI answering service at [phone] who will take a message and have the physician on call contact you.   Following lower endoscopy (colonoscopy or flexible sigmoidoscopy):  Excessive amounts of blood in the stool  Significant tenderness or worsening of abdominal pains  Swelling of the abdomen that is new, acute  Fever of 100F or higher  Following upper endoscopy (EGD)  Vomiting of blood or coffee ground material  New chest pain or pain under the shoulder blades  Painful or persistently difficult swallowing  New shortness of breath  Fever of 100F or higher  Black, tarry-looking stools  FOLLOW UP: If any biopsies were taken you will be contacted by phone or by letter within the next 1-3 weeks.  Call your gastroenterologist if you have not heard about the biopsies in 3 weeks.  Our staff will call the home number listed on your records the next business day following your procedure to check on you and address any questions or concerns that you may have at that time regarding the information given to you following your procedure. This is a courtesy call and so if there is no answer at the home number and we have not heard from you through the emergency physician on call, we will assume that you have returned to your regular daily activities without incident.  SIGNATURES/CONFIDENTIALITY: You and/or your care   partner have signed paperwork which will be entered into your electronic medical record.  These signatures attest to the fact that that the information above on your After Visit Summary has been reviewed and is understood.  Full responsibility of the confidentiality of this discharge information lies with you and/or your care-partner.   Repeat colonoscopy in 10 years  

## 2013-04-09 NOTE — Op Note (Signed)
[facility] [address] [place], [address]   COLONOSCOPY PROCEDURE REPORT  PATIENT: [name] [name]  MR#: [id number] BIRTHDATE: Jun 10, 1961 , 52  yrs. old GENDER: Male ENDOSCOPIST: Rachael Fee, MD PROCEDURE DATE:  04/09/2013 PROCEDURE:   Colonoscopy First Screening Colonoscopy - Avg.  risk and is 50 yrs.  old or older Yes.  Prior Negative Screening - Now for repeat screening. N/A  History of Adenoma - Now for follow-up colonoscopy & has been > or = to 3 yrs.  N/A  Polyps Removed Today? No.  Recommend repeat exam, <10 yrs? No. ASA CLASS:   Class II INDICATIONS:Seen in office 2-3 weeks ago with 3-4 years of loose stools, labs done which were all negative.  I recommended colonoscopy to evaluated the chronic loose stools.  Today in endoscopy he tells me that diarrhea completely resolved 1-2 weeks ago, he feels it was from a foot fungus medicine he was taking.  He still is interested in colonoscopy today for CRC screening. MEDICATIONS: Fentanyl 100 mcg IV, Versed 9 mg IV, and These medications were titrated to patient response per physician's verbal order  DESCRIPTION OF PROCEDURE:   After the risks benefits and alternatives of the procedure were thoroughly explained, informed consent was obtained.  A digital rectal exam revealed no abnormalities of the rectum.   The [id number]  endoscope was introduced through the anus and advanced to the surgical anastomosis. No adverse events experienced.   The quality of the prep was excellent.  The instrument was then slowly withdrawn as the colon was fully examined.  COLON FINDINGS: There was previous ileocecectomy anastomosis.  This was normal appearing.  The examination was otherwise normal. Retroflexion was not performed due to a narrow rectal vault. The time to cecum=minutes 0 seconds.  Withdrawal time=6 minutes 44 seconds.  The scope was withdrawn and the procedure completed. COMPLICATIONS: There were  no complications.  ENDOSCOPIC IMPRESSION: There was previous ileocecectomy anastomosis.  This was normal appearing.  The examination was otherwise normal.  RECOMMENDATIONS: You should continue to follow colorectal cancer screening guidelines for "routine risk" patients with a repeat colonoscopy in 10 years. There is no need for FOBT (stool) testing for at least 5 years.   eSigned:  Rachael Fee, MD 04/09/2013 3:16 PM

## 2013-04-10 ENCOUNTER — Telehealth: Payer: Self-pay | Admitting: *Deleted

## 2013-04-10 NOTE — Telephone Encounter (Signed)
Message left

## 2013-04-15 ENCOUNTER — Telehealth: Payer: Self-pay | Admitting: Gastroenterology

## 2013-04-15 NOTE — Telephone Encounter (Signed)
Letter has been faxed per pt request  

## 2013-04-16 ENCOUNTER — Telehealth: Payer: Self-pay | Admitting: Gastroenterology

## 2013-04-16 DIAGNOSIS — K469 Unspecified abdominal hernia without obstruction or gangrene: Secondary | ICD-10-CM

## 2013-04-16 NOTE — Telephone Encounter (Signed)
Referral has been made and pt is aware

## 2013-04-16 NOTE — Telephone Encounter (Signed)
Sure, can you refer him to [facility] to consider incisional hernia repair  Thanks

## 2013-04-16 NOTE — Telephone Encounter (Signed)
Dr Christella Hartigan the pt says that you had mentioned a referral to [facility] for hernia repair, can we set that up for him?

## 2013-04-24 ENCOUNTER — Ambulatory Visit (INDEPENDENT_AMBULATORY_CARE_PROVIDER_SITE_OTHER): Payer: BC Managed Care – PPO | Admitting: General Surgery

## 2013-04-25 ENCOUNTER — Ambulatory Visit (INDEPENDENT_AMBULATORY_CARE_PROVIDER_SITE_OTHER): Payer: BC Managed Care – PPO | Admitting: General Surgery

## 2013-04-25 ENCOUNTER — Encounter (INDEPENDENT_AMBULATORY_CARE_PROVIDER_SITE_OTHER): Payer: Self-pay | Admitting: General Surgery

## 2013-04-25 VITALS — BP 158/104 | HR 76 | Temp 97.8°F | Resp 16 | Ht 69.0 in | Wt 194.2 lb

## 2013-04-25 DIAGNOSIS — K469 Unspecified abdominal hernia without obstruction or gangrene: Secondary | ICD-10-CM

## 2013-04-25 NOTE — Progress Notes (Signed)
Patient ID: [name] Olivares Sr., male   DOB: 1961/01/19, 52 y.o.   MRN: [id number]  Chief Complaint  Patient presents with  . Hernia    new pt- evaql incisional hernia    HPI [name] Menning Sr. is a 52 y.o. male.  The patient is a 52 year old male who is referred by Dr. Christella Hartigan secondary to an incisional hernia. The patient had a previous ilealcectomy in March 2011 for a ruptured appendicitis. He states healing he is still is a hernia at his incision site.   States he feels a bulge when he increases his abdominal pressure. He has a spasm is unable to move her to 3 minutes. After relaxing he is able to move. He describes the pain more spasm  Then an actual stabbing pain.  The patient has no signs or symptoms of obstruction HPI  Past Medical History  Diagnosis Date  . HTN (hypertension)   . Heart murmur     Past Surgical History  Procedure Laterality Date  . Colon resection    . Appendectomy    . Meniscus repair Bilateral     Family History  Problem Relation Age of Onset  . Diabetes Father   . Hypertension Father     Social History History  Substance Use Topics  . Smoking status: Never Smoker   . Smokeless tobacco: Never Used  . Alcohol Use: Yes     Comment: 12 beers on the weekends    No Known Allergies  Current Outpatient Prescriptions  Medication Sig Dispense Refill  . montelukast (SINGULAIR) 10 MG tablet Take 10 mg by mouth at bedtime.       No current facility-administered medications for this visit.    Review of Systems Review of Systems  Constitutional: Negative.   HENT: Negative.   Respiratory: Negative.   Cardiovascular: Negative.   Gastrointestinal: Negative.   Neurological: Negative.   All other systems reviewed and are negative.    Blood pressure 158/104, pulse 76, temperature 97.8 F (36.6 C), temperature source Temporal, resp. rate 16, height 5\' 9"  (1.753 m), weight 194 lb 3.2 oz (88.089 kg).  Physical Exam Physical Exam    Constitutional: He is oriented to person, place, and time. He appears well-developed and well-nourished.  HENT:  Head: Normocephalic and atraumatic.  Eyes: Conjunctivae and EOM are normal. Pupils are equal, round, and reactive to light.  Neck: Normal range of motion. Neck supple.  Cardiovascular: Normal rate, regular rhythm and normal heart sounds.   Pulmonary/Chest: Effort normal and breath sounds normal.  Abdominal: Soft. A hernia is present. Hernia confirmed positive in the ventral area.    Small incisional hernia at the base of the wound   Musculoskeletal: Normal range of motion.  Neurological: He is alert and oriented to person, place, and time.  Skin: Skin is warm and dry.    Data Reviewed none  Assessment    52 year old male with questionable incisional hernia     Plan    1. We'll proceed with CT scan of his abdomen and pelvis to further evaluate his incision and or hernia. We discussed that this could be intermittent incarceration of possible bowel causes pain versus a possible muscle spasm. 2. After CT scan is obtained already this in call with results        Marigene Ehlers., [facility] 04/25/2013, 9:24 AM

## 2013-04-30 ENCOUNTER — Other Ambulatory Visit: Payer: BC Managed Care – PPO

## 2013-06-19 ENCOUNTER — Ambulatory Visit
Admission: RE | Admit: 2013-06-19 | Discharge: 2013-06-19 | Disposition: A | Discharge status: Home / Self Care | Payer: BC Managed Care – PPO | Source: Ambulatory Visit | Attending: General Surgery | Admitting: General Surgery

## 2013-06-19 DIAGNOSIS — K469 Unspecified abdominal hernia without obstruction or gangrene: Secondary | ICD-10-CM

## 2013-06-19 MED ORDER — IOHEXOL 300 MG/ML  SOLN
100.0000 mL | Freq: Once | INTRAMUSCULAR | Status: AC | PRN
  Administered 2013-06-19: 100 mL via INTRAVENOUS

## 2013-06-25 ENCOUNTER — Telehealth (INDEPENDENT_AMBULATORY_CARE_PROVIDER_SITE_OTHER): Payer: Self-pay | Admitting: General Surgery

## 2013-06-25 NOTE — Telephone Encounter (Signed)
Mr. [name] [name] in regards to his recent CT scan. I discussed with them there is only a very tiny hernia that seen on CT scan. He states that he still continues to have approximate same amount of pain. His main concern is that he did not want any hernia to get bigger. I discussed with them that his pain is likely coming from possible scar tissue in the area, and I do not think that any surgery would make this better and could possibly make it worse. He states he's not interested in any surgery at this time.

## 2013-06-25 NOTE — Telephone Encounter (Signed)
Patient stated that he will working second shift but he will have his cell on him and he will pick up and talk to Dr Derrell Lollingamirez. Cell is [phone]

## 2015-03-06 ENCOUNTER — Encounter (HOSPITAL_BASED_OUTPATIENT_CLINIC_OR_DEPARTMENT_OTHER): Payer: Self-pay | Admitting: *Deleted

## 2015-03-06 NOTE — Progress Notes (Signed)
NPO AFTER MN.  ARRIVE AT 0600.  NEEDS HG.  

## 2015-03-10 ENCOUNTER — Ambulatory Visit: Payer: Self-pay | Admitting: General Surgery

## 2015-03-10 NOTE — H&P (Signed)
History of Present Illness Axel Filler MD; 03/05/2015 10:07 AM) The patient is a 54 year old male who presents with hemorrhoids. 54 year old male with a history of hemorrhoids. Patient was previously seen and was prescribed Anusol for his bleeding hemorrhoids. The patient states he had minimal improvement of the hemorrhoids. Patient states he has painless bleeding. He states he has some stool seepage. As well as some mucous drainage. Patient states he has no pain with the bleeding.   Allergies Fay Records, CMA; 03/05/2015 9:44 AM) No Known Drug Allergies08/04/2015  Medication History Fay Records, CMA; 03/05/2015 9:45 AM) Singulair (  Tablet, Oral) Active. Medications Reconciled  Vitals Fay Records CMA; 03/05/2015 9:45 AM) 03/05/2015 9:45 AM Weight: 195 lb Height: 69in Body Surface Area: 2.08 m Body Mass Index: 28.8 kg/m Temp.: 98.38F  Pulse: 68 (Regular)  BP: 130/70 (Sitting, Left Arm, Standard)    Physical Exam Axel Filler MD; 03/05/2015 10:08 AM) General Mental Status-Alert. General Appearance-Consistent with stated age. Hydration-Well hydrated. Voice-Normal.  Head and Neck Head-normocephalic, atraumatic with no lesions or palpable masses.  Chest and Lung Exam Chest and lung exam reveals -quiet, even and easy respiratory effort with no use of accessory muscles, normal resonance, no flatness or dullness, non-tender and normal tactile fremitus and on auscultation, normal breath sounds, no adventitious sounds and normal vocal resonance. Inspection Chest Wall - Normal. Back - normal.  Cardiovascular Cardiovascular examination reveals -on palpation PMI is normal in location and amplitude, no palpable S3 or S4. Normal cardiac borders., normal heart sounds, regular rate and rhythm with no murmurs, carotid auscultation reveals no bruits and normal pedal pulses bilaterally.  Abdomen Inspection Normal Exam - No Hernias. Skin - Scar - no  surgical scars. Palpation/Percussion Normal exam - Soft, Non Tender, No Rebound tenderness, No Rigidity (guarding) and No hepatosplenomegaly. Auscultation Normal exam - Bowel sounds normal.  Rectal Anorectal Exam External - external hemorrhoids(The external hemorrhoid at approximately the 5 o'clock position, ulcerated.). Internal - normal sphincter tone. Proctoscopic exam-Internal hemorrhoids.  Neurologic Neurologic evaluation reveals -alert and oriented x 3 with no impairment of recent or remote memory. Mental Status-Normal.  Musculoskeletal Normal Exam - Left-Upper Extremity Strength Normal and Lower Extremity Strength Normal. Normal Exam - Right-Upper Extremity Strength Normal, Lower Extremity Weakness.    Assessment & Plan Axel Filler MD; 03/05/2015 10:11 AM) EXTERNAL HEMORRHOID, BLEEDING (K64.4) Impression: 54 year old male with external hemorrhoid times one, ulcerated  1. The patient will like to proceed to the operating room for exam under anesthesia and excision of hemorrhoid. 2. I discussed with the patient the risks and benefits of the procedure to include but not limited to: Infection, bleeding, damages running structures, possible incontinence, possible recurrence. The patient was understanding and wished to proceed.

## 2015-03-12 ENCOUNTER — Encounter (HOSPITAL_BASED_OUTPATIENT_CLINIC_OR_DEPARTMENT_OTHER): Payer: Self-pay | Admitting: Anesthesiology

## 2015-03-12 ENCOUNTER — Ambulatory Visit (HOSPITAL_BASED_OUTPATIENT_CLINIC_OR_DEPARTMENT_OTHER): Payer: BLUE CROSS/BLUE SHIELD | Admitting: Anesthesiology

## 2015-03-12 ENCOUNTER — Encounter (HOSPITAL_BASED_OUTPATIENT_CLINIC_OR_DEPARTMENT_OTHER)
Admission: RE | Disposition: A | Discharge status: Home / Self Care | Payer: Self-pay | Source: Ambulatory Visit | Attending: General Surgery

## 2015-03-12 ENCOUNTER — Ambulatory Visit (HOSPITAL_BASED_OUTPATIENT_CLINIC_OR_DEPARTMENT_OTHER)
Admission: RE | Admit: 2015-03-12 | Discharge: 2015-03-12 | Disposition: A | Discharge status: Home / Self Care | Payer: BLUE CROSS/BLUE SHIELD | Source: Ambulatory Visit | Attending: General Surgery | Admitting: General Surgery

## 2015-03-12 DIAGNOSIS — K648 Other hemorrhoids: Secondary | ICD-10-CM | POA: Diagnosis not present

## 2015-03-12 DIAGNOSIS — K644 Residual hemorrhoidal skin tags: Secondary | ICD-10-CM | POA: Insufficient documentation

## 2015-03-12 HISTORY — PX: EVALUATION UNDER ANESTHESIA WITH HEMORRHOIDECTOMY: SHX5624

## 2015-03-12 HISTORY — DX: Other allergy status, other than to drugs and biological substances: Z91.09

## 2015-03-12 HISTORY — DX: Residual hemorrhoidal skin tags: K64.4

## 2015-03-12 LAB — POCT HEMOGLOBIN-HEMACUE: Hemoglobin: 15.4 g/dL (ref 13.0–17.0)

## 2015-03-12 SURGERY — EXAM UNDER ANESTHESIA WITH HEMORRHOIDECTOMY
Anesthesia: General | Site: Rectum

## 2015-03-12 MED ORDER — DIBUCAINE 1 % RE OINT
TOPICAL_OINTMENT | RECTAL | Status: DC | PRN
  Administered 2015-03-12: 1 via RECTAL

## 2015-03-12 MED ORDER — ONDANSETRON HCL 4 MG/2ML IJ SOLN
INTRAMUSCULAR | Status: DC | PRN
  Administered 2015-03-12: 4 mg via INTRAVENOUS

## 2015-03-12 MED ORDER — ACETAMINOPHEN 325 MG PO TABS
650.0000 mg | ORAL_TABLET | ORAL | Status: DC | PRN
  Filled 2015-03-12: qty 2

## 2015-03-12 MED ORDER — MORPHINE SULFATE (PF) 2 MG/ML IV SOLN
2.0000 mg | INTRAVENOUS | Status: DC | PRN
  Filled 2015-03-12: qty 1

## 2015-03-12 MED ORDER — OXYCODONE HCL 5 MG PO TABS
5.0000 mg | ORAL_TABLET | ORAL | Status: DC | PRN
  Filled 2015-03-12: qty 2

## 2015-03-12 MED ORDER — PROPOFOL 10 MG/ML IV BOLUS
INTRAVENOUS | Status: DC | PRN
  Administered 2015-03-12: 200 mg via INTRAVENOUS

## 2015-03-12 MED ORDER — FENTANYL CITRATE (PF) 100 MCG/2ML IJ SOLN
INTRAMUSCULAR | Status: AC
  Filled 2015-03-12: qty 4

## 2015-03-12 MED ORDER — MIDAZOLAM HCL 2 MG/2ML IJ SOLN
INTRAMUSCULAR | Status: AC
  Filled 2015-03-12: qty 2

## 2015-03-12 MED ORDER — DEXAMETHASONE SODIUM PHOSPHATE 4 MG/ML IJ SOLN
INTRAMUSCULAR | Status: DC | PRN
  Administered 2015-03-12: 10 mg via INTRAVENOUS

## 2015-03-12 MED ORDER — SODIUM CHLORIDE 0.9 % IV SOLN
250.0000 mL | INTRAVENOUS | Status: DC | PRN
  Filled 2015-03-12: qty 250

## 2015-03-12 MED ORDER — PROMETHAZINE HCL 25 MG/ML IJ SOLN
6.2500 mg | INTRAMUSCULAR | Status: DC | PRN
  Filled 2015-03-12: qty 1

## 2015-03-12 MED ORDER — SODIUM CHLORIDE 0.9 % IJ SOLN
3.0000 mL | INTRAMUSCULAR | Status: DC | PRN
  Filled 2015-03-12: qty 3

## 2015-03-12 MED ORDER — SODIUM CHLORIDE 0.9 % IV SOLN
INTRAVENOUS | Status: DC | PRN
  Administered 2015-03-12: 40 mL

## 2015-03-12 MED ORDER — DOCUSATE SODIUM 100 MG PO CAPS
100.0000 mg | ORAL_CAPSULE | Freq: Two times a day (BID) | ORAL | Status: DC
Start: 2015-03-12 — End: 2018-07-24

## 2015-03-12 MED ORDER — FENTANYL CITRATE (PF) 100 MCG/2ML IJ SOLN
25.0000 ug | INTRAMUSCULAR | Status: DC | PRN
  Filled 2015-03-12: qty 1

## 2015-03-12 MED ORDER — OXYCODONE-ACETAMINOPHEN 7.5-325 MG PO TABS: 1.0000 | ORAL_TABLET | ORAL | Status: DC | PRN

## 2015-03-12 MED ORDER — MIDAZOLAM HCL 5 MG/5ML IJ SOLN
INTRAMUSCULAR | Status: DC | PRN
  Administered 2015-03-12: 2 mg via INTRAVENOUS

## 2015-03-12 MED ORDER — LACTATED RINGERS IV SOLN
INTRAVENOUS | Status: DC
  Administered 2015-03-12 (×2): via INTRAVENOUS
  Filled 2015-03-12: qty 1000

## 2015-03-12 MED ORDER — ACETAMINOPHEN 650 MG RE SUPP
650.0000 mg | RECTAL | Status: DC | PRN
  Filled 2015-03-12: qty 1

## 2015-03-12 MED ORDER — LIDOCAINE HCL (CARDIAC) 20 MG/ML IV SOLN
INTRAVENOUS | Status: DC | PRN
Start: 2015-03-12 — End: 2015-03-12
  Administered 2015-03-12: 100 mg via INTRAVENOUS

## 2015-03-12 MED ORDER — FENTANYL CITRATE (PF) 100 MCG/2ML IJ SOLN
INTRAMUSCULAR | Status: DC | PRN
  Administered 2015-03-12: 100 ug via INTRAVENOUS
  Administered 2015-03-12 (×2): 50 ug via INTRAVENOUS

## 2015-03-12 MED ORDER — SODIUM CHLORIDE 0.9 % IJ SOLN
3.0000 mL | Freq: Two times a day (BID) | INTRAMUSCULAR | Status: DC
  Filled 2015-03-12: qty 3

## 2015-03-12 SURGICAL SUPPLY — 47 items
BLADE HEX COATED 2.75 (ELECTRODE) ×3 IMPLANT
BLADE SURG 15 STRL LF DISP TIS (BLADE) ×1 IMPLANT
BLADE SURG 15 STRL SS (BLADE) ×3
DECANTER SPIKE VIAL GLASS SM (MISCELLANEOUS) ×1 IMPLANT
DRAPE LAPAROTOMY T 102X78X121 (DRAPES) IMPLANT
DRAPE SHEET LG 3/4 BI-LAMINATE (DRAPES) ×2 IMPLANT
DRAPE UNDERBUTTOCKS STRL (DRAPE) ×3 IMPLANT
DRSG PAD ABDOMINAL 8X10 ST (GAUZE/BANDAGES/DRESSINGS) ×2 IMPLANT
ELECT REM PT RETURN 9FT ADLT (ELECTROSURGICAL) ×3
ELECTRODE REM PT RTRN 9FT ADLT (ELECTROSURGICAL) ×1 IMPLANT
GAUZE SPONGE 4X4 12PLY STRL (GAUZE/BANDAGES/DRESSINGS) ×2 IMPLANT
GAUZE SPONGE 4X4 16PLY XRAY LF (GAUZE/BANDAGES/DRESSINGS) ×3 IMPLANT
GLOVE BIO SURGEON STRL SZ7.5 (GLOVE) ×1 IMPLANT
GLOVE BIOGEL PI IND STRL 6.5 (GLOVE) IMPLANT
GLOVE BIOGEL PI IND STRL 7.5 (GLOVE) IMPLANT
GLOVE BIOGEL PI IND STRL 8 (GLOVE) ×1 IMPLANT
GLOVE BIOGEL PI INDICATOR 6.5 (GLOVE) ×2
GLOVE BIOGEL PI INDICATOR 7.5 (GLOVE) ×6
GLOVE BIOGEL PI INDICATOR 8 (GLOVE)
GLOVE INDICATOR 6.5 STRL GRN (GLOVE) ×2 IMPLANT
GOWN STRL REUS W/ TWL XL LVL3 (GOWN DISPOSABLE) ×1 IMPLANT
GOWN STRL REUS W/TWL LRG LVL3 (GOWN DISPOSABLE) ×3 IMPLANT
GOWN STRL REUS W/TWL XL LVL3 (GOWN DISPOSABLE) ×3 IMPLANT
KIT ROOM TURNOVER WOR (KITS) ×3 IMPLANT
LEGGING LITHOTOMY PAIR STRL (DRAPES) ×2 IMPLANT
LUBRICANT JELLY K Y 4OZ (MISCELLANEOUS) ×3 IMPLANT
MANIFOLD NEPTUNE II (INSTRUMENTS) ×2 IMPLANT
NDL HYPO 25X1 1.5 SAFETY (NEEDLE) ×1 IMPLANT
NDL SAFETY ECLIPSE 18X1.5 (NEEDLE) ×1 IMPLANT
NEEDLE HYPO 18GX1.5 SHARP (NEEDLE)
NEEDLE HYPO 25X1 1.5 SAFETY (NEEDLE) ×3 IMPLANT
NS IRRIG 500ML POUR BTL (IV SOLUTION) ×3 IMPLANT
PACK BASIN DAY SURGERY FS (CUSTOM PROCEDURE TRAY) ×3 IMPLANT
PACK LITHOTOMY IV (CUSTOM PROCEDURE TRAY) ×1 IMPLANT
PENCIL BUTTON HOLSTER BLD 10FT (ELECTRODE) ×3 IMPLANT
SHEARS HARMONIC 9CM CVD (BLADE) ×3 IMPLANT
SPONGE GAUZE 4X4 12PLY STER LF (GAUZE/BANDAGES/DRESSINGS) ×2 IMPLANT
SPONGE SURGIFOAM ABS GEL 12-7 (HEMOSTASIS) ×3 IMPLANT
SUT CHROMIC 2 0 SH (SUTURE) ×2 IMPLANT
SUT CHROMIC 3 0 SH 27 (SUTURE) ×2 IMPLANT
SYR BULB 3OZ (MISCELLANEOUS) ×3 IMPLANT
SYR CONTROL 10ML LL (SYRINGE) ×3 IMPLANT
TOWEL OR 17X24 6PK STRL BLUE (TOWEL DISPOSABLE) ×6 IMPLANT
TRAY DSU PREP LF (CUSTOM PROCEDURE TRAY) ×2 IMPLANT
TUBE CONNECTING 12'X1/4 (SUCTIONS) ×1
TUBE CONNECTING 12X1/4 (SUCTIONS) ×2 IMPLANT
YANKAUER SUCT BULB TIP NO VENT (SUCTIONS) ×3 IMPLANT

## 2015-03-12 NOTE — Anesthesia Postprocedure Evaluation (Signed)
  Anesthesia Post-op Note  Patient: [name] [name].  Procedure(s) Performed: Procedure(s) (LRB): EXAM UNDER ANESTHESIA WITH HEMORRHOIDECTOMY (N/A)  Patient Location: PACU  Anesthesia Type: General  Level of Consciousness: awake and alert   Airway and Oxygen Therapy: Patient Spontanous Breathing  Post-op Pain: mild  Post-op Assessment: Post-op Vital signs reviewed, Patient's Cardiovascular Status Stable, Respiratory Function Stable, Patent Airway and No signs of Nausea or vomiting  Last Vitals:  Filed Vitals:   03/12/15 0957  BP: 160/104  Pulse: 65  Temp: 36.7 C  Resp: 16    Post-op Vital Signs: stable   Complications: No apparent anesthesia complications

## 2015-03-12 NOTE — H&P (View-Only) (Signed)
History of Present Illness ([name] Ramirez MD; 03/05/2015 10:07 AM) The patient is a 54 year old male who presents with hemorrhoids. 54-year-old male with a history of hemorrhoids. Patient was previously seen and was prescribed Anusol for his bleeding hemorrhoids. The patient states he had minimal improvement of the hemorrhoids. Patient states he has painless bleeding. He states he has some stool seepage. As well as some mucous drainage. Patient states he has no pain with the bleeding.   Allergies ([name], CMA; 03/05/2015 9:44 AM) No Known Drug Allergies08/04/2015  Medication History ([name], CMA; 03/05/2015 9:45 AM) Singulair (10MG Tablet, Oral) Active. Medications Reconciled  Vitals ([name] CMA; 03/05/2015 9:45 AM) 03/05/2015 9:45 AM Weight: 195 lb Height: 69in Body Surface Area: 2.08 m Body Mass Index: 28.8 kg/m Temp.: 98.1F  Pulse: 68 (Regular)  BP: 130/70 (Sitting, Left Arm, Standard)    Physical Exam ([name] Ramirez MD; 03/05/2015 10:08 AM) General Mental Status-Alert. General Appearance-Consistent with stated age. Hydration-Well hydrated. Voice-Normal.  Head and Neck Head-normocephalic, atraumatic with no lesions or palpable masses.  Chest and Lung Exam Chest and lung exam reveals -quiet, even and easy respiratory effort with no use of accessory muscles, normal resonance, no flatness or dullness, non-tender and normal tactile fremitus and on auscultation, normal breath sounds, no adventitious sounds and normal vocal resonance. Inspection Chest Wall - Normal. Back - normal.  Cardiovascular Cardiovascular examination reveals -on palpation PMI is normal in location and amplitude, no palpable S3 or S4. Normal cardiac borders., normal heart sounds, regular rate and rhythm with no murmurs, carotid auscultation reveals no bruits and normal pedal pulses bilaterally.  Abdomen Inspection Normal Exam - No Hernias. Skin - Scar - no  surgical scars. Palpation/Percussion Normal exam - Soft, Non Tender, No Rebound tenderness, No Rigidity (guarding) and No hepatosplenomegaly. Auscultation Normal exam - Bowel sounds normal.  Rectal Anorectal Exam External - external hemorrhoids(The external hemorrhoid at approximately the 5 o'clock position, ulcerated.). Internal - normal sphincter tone. Proctoscopic exam-Internal hemorrhoids.  Neurologic Neurologic evaluation reveals -alert and oriented x 3 with no impairment of recent or remote memory. Mental Status-Normal.  Musculoskeletal Normal Exam - Left-Upper Extremity Strength Normal and Lower Extremity Strength Normal. Normal Exam - Right-Upper Extremity Strength Normal, Lower Extremity Weakness.    Assessment & Plan ([name] Ramirez MD; 03/05/2015 10:11 AM) EXTERNAL HEMORRHOID, BLEEDING (K64.4) Impression: 54-year-old male with external hemorrhoid times one, ulcerated  1. The patient will like to proceed to the operating room for exam under anesthesia and excision of hemorrhoid. 2. I discussed with the patient the risks and benefits of the procedure to include but not limited to: Infection, bleeding, damages running structures, possible incontinence, possible recurrence. The patient was understanding and wished to proceed. 

## 2015-03-12 NOTE — Interval H&P Note (Signed)
History and Physical Interval Note:  03/12/2015 7:22 AM  [name] Saa Sr.  has presented today for surgery, with the diagnosis of external hemorrhoid  The various methods of treatment have been discussed with the patient and family. After consideration of risks, benefits and other options for treatment, the patient has consented to  Procedure(s): EXAM UNDER ANESTHESIA WITH HEMORRHOIDECTOMY (N/A) as a surgical intervention .  The patient's history has been reviewed, patient examined, no change in status, stable for surgery.  I have reviewed the patient's chart and labs.  Questions were answered to the patient's satisfaction.     Marigene Ehlers., Jed Limerick

## 2015-03-12 NOTE — Transfer of Care (Signed)
Immediate Anesthesia Transfer of Care Note  Patient: [name] Saa Sr.  Procedure(s) Performed: Procedure(s): EXAM UNDER ANESTHESIA WITH HEMORRHOIDECTOMY (N/A)  Patient Location: PACU  Anesthesia Type:General  Level of Consciousness: awake, alert , oriented and patient cooperative  Airway & Oxygen Therapy: Patient Spontanous Breathing and Patient connected to nasal cannula oxygen  Post-op Assessment: Report given to RN and Post -op Vital signs reviewed and stable  Post vital signs: Reviewed and stable  Last Vitals:  Filed Vitals:   03/12/15 0827  BP: 177/118  Pulse: 77  Temp: 36.4 C  Resp: 20    Complications: No apparent anesthesia complications

## 2015-03-12 NOTE — Discharge Instructions (Signed)
ANORECTAL SURGERY:  POST OPERATIVE INSTRUCTIONS  1. Take your usually prescribed home medications unless otherwise directed. 2. DIET: Follow a light bland diet the first 24 hours after arrival home, such as soup, liquids, crackers, etc.  Be sure to include lots of fluids daily.  Avoid fast food or heavy meals as your are more likely to get nauseated.  Eat a low fat the next few days after surgery.   3. PAIN CONTROL: a. Pain is best controlled by a usual combination of three different methods TOGETHER: i. Ice/Heat ii. Over the counter pain medication iii. Prescription pain medication b. Most patients will experience some swelling and discomfort in the anus/rectal area. and incisions.  Ice packs or heat (30-60 minutes up to 6 times a day) will help. Use ice for the first few days to help decrease swelling and bruising, then switch to heat such as warm towels, sitz baths, warm baths, etc to help relax tight/sore spots and speed recovery.  Some people prefer to use ice alone, heat alone, alternating between ice & heat.  Experiment to what works for you.  Swelling and bruising can take several weeks to resolve.   c. It is helpful to take an over-the-counter pain medication regularly for the first few weeks.  Choose one of the following that works best for you: i. Naproxen (Aleve, etc)  Two 232m tabs twice a day ii. Ibuprofen (Advil, etc) Three 2017mtabs four times a day (every meal & bedtime) iii. Acetaminophen (Tylenol, etc) 500-65058mour times a day (every meal & bedtime) d. A  prescription for pain medication (such as oxycodone, hydrocodone, etc) should be given to you upon discharge.  Take your pain medication as prescribed.  i. If you are having problems/concerns with the prescription medicine (does not control pain, nausea, vomiting, rash, itching, etc), please call us [phone] see if we need to switch you to a different pain medicine that will work better for you and/or control your  side effect better. ii. If you need a refill on your pain medication, please contact your pharmacy.  They will contact our office to request authorization. Prescriptions will not be filled after 5 pm or on week-ends.  Use a Sitz Bath 4-8 times a day for relief   [employer]itz bath is a warm water bath taken in the sitting position that covers only the hips and buttocks. It may be used for either healing or hygiene purposes. Sitz baths are also used to relieve pain, itching, or muscle spasms. The water may contain medicine. Moist heat will help you heal and relax.  HOME CARE INSTRUCTIONS  Take 3 to 4 sitz baths a day. 1. Fill the bathtub half full with warm water. 2. Sit in the water and open the drain a little. 3. Turn on the warm water to keep the tub half full. Keep the water running constantly. 4. Soak in the water for 15 to 20 minutes. 5. After the sitz bath, pat the affected area dry first.   4. KEEP YOUR BOWELS REGULAR a. The goal is one bowel movement a day b. Avoid getting constipated.  Between the surgery and the pain medications, it is common to experience some constipation.  Increasing fluid intake and taking a fiber supplement (such as Metamucil, Citrucel, FiberCon, MiraLax, etc) 1-2 times a day regularly will usually help prevent this problem from occurring.  A mild laxative (prune juice, Milk of Magnesia, MiraLax, etc) should be taken according to package  directions if there are no bowel movements after 48 hours. c. Watch out for diarrhea.  If you have many loose bowel movements, simplify your diet to bland foods & liquids for a few days.  Stop any stool softeners and decrease your fiber supplement.  Switching to mild anti-diarrheal medications (Kayopectate, Pepto Bismol) can help.  If this worsens or does not improve, please call us.  5. Wound Care a. Remove your bandages the day after surgery.  Unless discharge instructions indicate otherwise, leave your bandage dry and in  place overnight.  Remove the bandage during your first bowel movement.   b. Allow the wound packing to fall out over the next few days.  You can trim exposed gauze / ribbon as it falls out.  You do not need to repack the wound unless instructed otherwise.  Wear an absorbent pad or soft cotton gauze in your underwear as needed to catch any drainage and help keep the area  c. Keep the area clean and dry.  Bathe / shower every day.  Keep the area clean by showering / bathing over the incision / wound.   It is okay to soak an open wound to help wash it.  Wet wipes or showers / gentle washing after bowel movements is often less traumatic than regular toilet paper. d. Bonita Quin may have some styrofoam-like soft packing in the rectum which will come out with the first bowel movement.  e. You will often notice bleeding with bowel movements.  This should slow down by the end of the first week of surgery f. Expect some drainage.  This should slow down, too, by the end of the first week of surgery.  Wear an absorbent pad or soft cotton gauze in your underwear until the drainage stops. 6. ACTIVITIES as tolerated:   a. You may resume regular (light) daily activities beginning the next day--such as daily self-care, walking, climbing stairs--gradually increasing activities as tolerated.  If you can walk 30 minutes without difficulty, it is safe to try more intense activity such as jogging, treadmill, bicycling, low-impact aerobics, swimming, etc. b. Save the most intensive and strenuous activity for last such as sit-ups, heavy lifting, contact sports, etc  Refrain from any heavy lifting or straining until you are off narcotics for pain control.   c. DO NOT PUSH THROUGH PAIN.  Let pain be your guide: If it hurts to do something, don't do it.  Pain is your body warning you to avoid that activity for another week until the pain goes down. d. You may drive when you are no longer taking prescription pain medication, you can  comfortably sit for long periods of time, and you can safely maneuver your car and apply brakes. e. Bonita Quin may have sexual intercourse when it is comfortable.  7. FOLLOW UP in our office a. Please call [facility] at [phone] to set up an appointment to see your surgeon in the office for a follow-up appointment approximately 2 weeks after your surgery. b. Make sure that you call for this appointment the day you arrive home to insure a convenient appointment time. 10. IF YOU HAVE DISABILITY OR FAMILY LEAVE FORMS, BRING THEM TO THE OFFICE FOR PROCESSING.  DO NOT GIVE THEM TO YOUR DOCTOR.   WHEN TO CALL us [phone]: 1. Poor pain control 2. Reactions / problems with new medications (rash/itching, nausea, etc)  3. Fever over 101.5 F (38.5 C) 4. Inability to urinate 5. Nausea and/or vomiting 6. Worsening swelling or  bruising 7. Continued bleeding from incision. 8. Increased pain, redness, or drainage from the incision  The clinic staff is available to answer your questions during regular business hours (8:30am-5pm).  Please dont hesitate to call and ask to speak to one of our nurses for clinical concerns.   A surgeon from [facility] is always on call at the hospitals   If you have a medical emergency, go to the nearest emergency room or call 911.    [facility] [address], [place], [place]  [address] ? MAIN: [phone] ? TOLL FREE: [phone] ? FAX [phone] [email or web address]      Post Anesthesia Home Care Instructions  Activity: Get plenty of rest for the remainder of the day. A responsible adult should stay with you for 24 hours following the procedure.  For the next 24 hours, DO NOT: -Drive a car -Advertising copywriter -Drink alcoholic beverages -Take any medication unless instructed by your physician -Make any legal decisions or sign important papers.  Meals: Start with liquid foods such as gelatin or  soup. Progress to regular foods as tolerated. Avoid greasy, spicy, heavy foods. If nausea and/or vomiting occur, drink only clear liquids until the nausea and/or vomiting subsides. Call your physician if vomiting continues.  Special Instructions/Symptoms: Your throat may feel dry or sore from the anesthesia or the breathing tube placed in your throat during surgery. If this causes discomfort, gargle with warm salt water. The discomfort should disappear within 24 hours.  If you had a scopolamine patch placed behind your ear for the management of post- operative nausea and/or vomiting:  1. The medication in the patch is effective for 72 hours, after which it should be removed.  Wrap patch in a tissue and discard in the trash. Wash hands thoroughly with soap and water. 2. You may remove the patch earlier than 72 hours if you experience unpleasant side effects which may include dry mouth, dizziness or visual disturbances. 3. Avoid touching the patch. Wash your hands with soap and water after contact with the patch.     Bupivacaine Liposomal Suspension for Injection What is this medicine? BUPIVACAINE LIPOSOMAL (bue PIV a kane LIP oh som al) is an anesthetic. It causes loss of feeling in the skin or other tissues. It is used to prevent and to treat pain from some procedures. This medicine may be used for other purposes; ask your health care provider or pharmacist if you have questions. COMMON BRAND NAME(S): EXPAREL What should I tell my health care provider before I take this medicine? They need to know if you have any of these conditions: -heart disease -kidney disease -liver disease -an unusual or allergic reaction to bupivacaine, other medicines, foods, dyes, or preservatives -pregnant or trying to get pregnant -breast-feeding How should I use this medicine? This medicine is for injection into the affected area. It is given by a health care professional in a hospital or clinic setting. Talk  to your pediatrician regarding the use of this medicine in children. Special care may be needed. Overdosage: If you think you've taken too much of this medicine contact a poison control center or emergency room at once. Overdosage: If you think you have taken too much of this medicine contact a poison control center or emergency room at once. NOTE: This medicine is only for you. Do not share this medicine with others. What if I miss a dose? This does not apply. What may interact with this medicine? -  other anesthetics This list may not describe all possible interactions. Give your health care provider a list of all the medicines, herbs, non-prescription drugs, or dietary supplements you use. Also tell them if you smoke, drink alcohol, or use illegal drugs. Some items may interact with your medicine. What should I watch for while using this medicine? Your condition will be monitored carefully while you are receiving this medicine. Be careful to avoid injury while the area is numb and you are not aware of pain. Do not use another medicine that contains bupivacaine for 96 hours after receiving bupivacaine liposomal. You may have more side effects. Talk to your doctor if you have questions. What side effects may I notice from receiving this medicine? Side effects that you should report to your doctor or health care professional as soon as possible: -allergic reactions like skin rash, itching or hives, swelling of the face, lips, or tongue -breathing problems -changes in vision -dizziness -fast or slow, irregular heartbeat -joint pain, stiffness, or loss of motion -seizures Side effects that usually do not require medical attention (Report these to your doctor or health care professional if they continue or are bothersome.): -constipation -irritation at site where injected -nausea, vomiting -tiredness This list may not describe all possible side effects. Call your doctor for medical advice about  side effects. You may report side effects to FDA at 1-800-FDA-1088. Where should I keep my medicine? This drug is given in a hospital or clinic and will not be stored at home. NOTE: This sheet is a summary. It may not cover all possible information. If you have questions about this medicine, talk to your doctor, pharmacist, or health care provider.  2015, [employer]vier/[employer] Standard. (2012-10-23 12:35:57)

## 2015-03-12 NOTE — Anesthesia Preprocedure Evaluation (Addendum)
Anesthesia Evaluation  Patient identified by MRN, date of birth, ID band Patient awake    Reviewed: Allergy & Precautions, NPO status , Patient's Chart, lab work & pertinent test results  Airway Mallampati: II  TM Distance: >3 FB Neck ROM: Full    Dental no notable dental hx. (+) Teeth Intact, Dental Advisory Given   Pulmonary neg pulmonary ROS,    Pulmonary exam normal breath sounds clear to auscultation       Cardiovascular Exercise Tolerance: Good negative cardio ROS Normal cardiovascular exam Rhythm:Regular Rate:Normal     Neuro/Psych negative neurological ROS  negative psych ROS   GI/Hepatic negative GI ROS, Neg liver ROS,   Endo/Other  negative endocrine ROS  Renal/GU negative Renal ROS  negative genitourinary   Musculoskeletal negative musculoskeletal ROS (+)   Abdominal   Peds negative pediatric ROS (+)  Hematology negative hematology ROS (+)   Anesthesia Other Findings   Reproductive/Obstetrics negative OB ROS                           Anesthesia Physical Anesthesia Plan  ASA: I  Anesthesia Plan: General   Post-op Pain Management:    Induction: Intravenous  Airway Management Planned: LMA  Additional Equipment:   Intra-op Plan:   Post-operative Plan: Extubation in OR  Informed Consent: I have reviewed the patients History and Physical, chart, labs and discussed the procedure including the risks, benefits and alternatives for the proposed anesthesia with the patient or authorized representative who has indicated his/her understanding and acceptance.   Dental advisory given  Plan Discussed with: CRNA  Anesthesia Plan Comments:       Anesthesia Quick Evaluation

## 2015-03-12 NOTE — Op Note (Signed)
03/12/2015  8:01 AM  PATIENT:  [name] Saa Sr.  54 y.o. male  PRE-OPERATIVE DIAGNOSIS:  external hemorrhoid  POST-OPERATIVE DIAGNOSIS:  external hemorrhoids x2 columns  PROCEDURE:  Procedure(s): EXAM UNDER ANESTHESIA WITH HEMORRHOIDECTOMY x2 columns(N/A)  SURGEON:  Surgeon(s) and Role:    * Axel Filler, MD - Primary  ANESTHESIA:   local and general  EBL:   5cc  BLOOD ADMINISTERED:none  DRAINS: none   LOCAL MEDICATIONS USED:  OTHER bupivicaine and Exparil  SPECIMEN:  Source of Specimen:  Hemorrhoid x 2 columns  DISPOSITION OF SPECIMEN:  PATHOLOGY  COUNTS:  YES  TOURNIQUET:  * No tourniquets in log *  DICTATION: .Dragon Dictation The patient is a 54 year old male who was seen in clinic secondary to bleeding hemorrhoids. The patient decided to have these electively removed.   Details of the procedure: After the patient was consented patient was taken back to the operating room and placed in the lithotomy position with bilateral testes in place. He underwent general endotracheal anesthesia. The patient was then prepped and draped in the usual sterile fashion. A timeout was called all facts were verified.  Exam under anesthesia was begun using a digital rectal exam. There was apparent external hemorrhoid seen. There was no masses palpated. At this time a harmonic scalpel was used to excise the pedunculated hemorrhoid at the 7:00 position. There was minimal bleeding. A second hemorrhoid was seen at the 5:00 position. This column was also excised with the Harmonic scalpel. These were sent to pathology. At this time 2-0 chromic stitches were used in a standard running locking fashion to reapproximate the mucosa. This was done to each excision site. At this time 40 mL of Exparil diluted to 50% with 20 mL of saline was injected circumferentially around the anus. A bupivacaine ointment covered Gelfoam was placed in the rectum. The wound was dressed with 4 x 4's, AVD pad, and  mesh panties.  The patient tied the procedure well was taken to the recovery room in stable condition.   PLAN OF CARE: Discharge to home after PACU  PATIENT DISPOSITION:  PACU - hemodynamically stable.   Delay start of Pharmacological VTE agent (>24hrs) due to surgical blood loss or risk of bleeding: not applicable

## 2015-03-12 NOTE — Anesthesia Procedure Notes (Addendum)
Procedure Name: LMA Insertion Date/Time: 03/12/2015 7:34 AM Performed by: Tyrone Nine Pre-anesthesia Checklist: Patient identified, Timeout performed, Emergency Drugs available, Suction available and Patient being monitored Patient Re-evaluated:Patient Re-evaluated prior to inductionOxygen Delivery Method: Circle system utilized Preoxygenation: Pre-oxygenation with 100% oxygen Intubation Type: IV induction Ventilation: Mask ventilation without difficulty LMA: LMA inserted LMA Size: 5.0 Number of attempts: 1 Airway Equipment and Method: Bite block Placement Confirmation: positive ETCO2 and breath sounds checked- equal and bilateral Tube secured with: Tape

## 2015-03-13 ENCOUNTER — Encounter (HOSPITAL_BASED_OUTPATIENT_CLINIC_OR_DEPARTMENT_OTHER): Payer: Self-pay | Admitting: General Surgery

## 2017-09-01 ENCOUNTER — Other Ambulatory Visit: Payer: Self-pay

## 2017-09-01 ENCOUNTER — Encounter (HOSPITAL_COMMUNITY): Payer: Self-pay | Admitting: Emergency Medicine

## 2017-09-01 DIAGNOSIS — M545 Low back pain: Secondary | ICD-10-CM | POA: Insufficient documentation

## 2017-09-01 DIAGNOSIS — R51 Headache: Secondary | ICD-10-CM | POA: Insufficient documentation

## 2017-09-01 DIAGNOSIS — Z79899 Other long term (current) drug therapy: Secondary | ICD-10-CM | POA: Insufficient documentation

## 2017-09-01 LAB — URINALYSIS, ROUTINE W REFLEX MICROSCOPIC
BACTERIA UA: NONE SEEN
Bilirubin Urine: NEGATIVE
GLUCOSE, UA: NEGATIVE mg/dL
Ketones, ur: NEGATIVE mg/dL
LEUKOCYTES UA: NEGATIVE
NITRITE: NEGATIVE
PH: 6 (ref 5.0–8.0)
Protein, ur: NEGATIVE mg/dL
RBC / HPF: NONE SEEN RBC/hpf (ref 0–5)
SPECIFIC GRAVITY, URINE: 1.001 — AB (ref 1.005–1.030)
SQUAMOUS EPITHELIAL / LPF: NONE SEEN
WBC, UA: NONE SEEN WBC/hpf (ref 0–5)

## 2017-09-01 LAB — RAPID URINE DRUG SCREEN, HOSP PERFORMED
AMPHETAMINES: NOT DETECTED
BARBITURATES: NOT DETECTED
Benzodiazepines: NOT DETECTED
COCAINE: NOT DETECTED
OPIATES: NOT DETECTED
TETRAHYDROCANNABINOL: NOT DETECTED

## 2017-09-01 LAB — BASIC METABOLIC PANEL
Anion gap: 12 (ref 5–15)
BUN: 13 mg/dL (ref 6–20)
CALCIUM: 9.8 mg/dL (ref 8.9–10.3)
CHLORIDE: 101 mmol/L (ref 101–111)
CO2: 25 mmol/L (ref 22–32)
CREATININE: 1.02 mg/dL (ref 0.61–1.24)
GFR calc non Af Amer: >60 mL/min (ref >60)
GLUCOSE: 95 mg/dL (ref 65–99)
Potassium: 3.9 mmol/L (ref 3.5–5.1)
Sodium: 138 mmol/L (ref 135–145)

## 2017-09-01 LAB — CBC
HEMATOCRIT: 48.3 % (ref 39.0–52.0)
HEMOGLOBIN: 16.4 g/dL (ref 13.0–17.0)
MCH: 30.5 pg (ref 26.0–34.0)
MCHC: 34 g/dL (ref 30.0–36.0)
MCV: 89.9 fL (ref 78.0–100.0)
Platelets: 226 10*3/uL (ref 150–400)
RBC: 5.37 MIL/uL (ref 4.22–5.81)
RDW: 14.5 % (ref 11.5–15.5)
WBC: 5.4 10*3/uL (ref 4.0–10.5)

## 2017-09-01 LAB — ETHANOL: Alcohol, Ethyl (B): 180 mg/dL — ABNORMAL HIGH (ref <10)

## 2017-09-01 NOTE — ED Triage Notes (Signed)
Pt presents intoxicated... States "idk whats going on but theres something wrong with me" theres something shooting from my head down to my feet, this has been going on since November. Pt admits to drinking "3-4 beers"

## 2017-09-02 ENCOUNTER — Emergency Department (HOSPITAL_COMMUNITY): Payer: Managed Care, Other (non HMO)

## 2017-09-02 ENCOUNTER — Emergency Department (HOSPITAL_COMMUNITY)
Admission: EM | Admit: 2017-09-02 | Discharge: 2017-09-02 | Disposition: A | Discharge status: Home / Self Care | Payer: Managed Care, Other (non HMO) | Attending: Emergency Medicine | Admitting: Emergency Medicine

## 2017-09-02 DIAGNOSIS — R51 Headache: Secondary | ICD-10-CM

## 2017-09-02 DIAGNOSIS — G8929 Other chronic pain: Secondary | ICD-10-CM

## 2017-09-02 DIAGNOSIS — M545 Low back pain: Secondary | ICD-10-CM | POA: Diagnosis not present

## 2017-09-02 DIAGNOSIS — R519 Headache, unspecified: Secondary | ICD-10-CM

## 2017-09-02 DIAGNOSIS — M5440 Lumbago with sciatica, unspecified side: Secondary | ICD-10-CM

## 2017-09-02 MED ORDER — METHOCARBAMOL 500 MG PO TABS: 500.0000 mg | ORAL_TABLET | Freq: Two times a day (BID) | ORAL | 0 refills | Status: DC

## 2017-09-02 NOTE — ED Notes (Signed)
H. Muthersbaugh PA explained tests results and plan of care to pt. and family .

## 2017-09-02 NOTE — Discharge Instructions (Addendum)
1. Medications: Tylenol (up to 4000mg  every 24 hours), robaxin, usual home medications 2. Treatment: rest, drink plenty of fluids, do not combine with alcohol 3. Follow Up: Please followup with your primary doctor in 2-3 days for discussion of your diagnoses and further evaluation after today's visit; if you do not have a primary care doctor use the resource guide provided to find one; Please return to the ER for vision changes, syncope, weakness or other concerns.  Please also follow-up with neurology

## 2017-09-02 NOTE — ED Notes (Signed)
Patient returned from CT scan , he and his spouse expressed dismay and upset because they are in a hallway bed , nurse apologized and explained process/high census to pt. and family but remained discontented/dissatisfied.

## 2017-09-02 NOTE — ED Provider Notes (Signed)
[facility] EMERGENCY DEPARTMENT Provider Note   CSN: [id number] Arrival date & time: 09/01/17  2202     History   Chief Complaint Chief Complaint  Patient presents with  . Pain    HPI [name] Zahradnik Sr. is a 57 y.o. male with a hx of abd hernia, arthritis (knees), chronic low back pain presents to the Emergency Department complaining of gradual, persistent, progressively worsening "pain."  Pt reports intermittent shooting and burning generalized headache onset several months ago but increasing in frequency in the last several weeks.  Pt reports episodes last approx 10 minutes and improve with closing his eyes.  When it occurs it is an 8/10.  Pt denies thunderclap headache, vision changes, tinnitus or worst headache of his life.  Patient reports low back pain with radiation down his posterior thighs.  He also reports chronic bilateral knee pain and has been informed that he does need knee replacements.  He has taken Tramadol in the past but does not usually take this as it causes diarrhea.  He reports he is is unable to take NSAIDs due to previous colon resection but does sometimes take [facility] powders with moderate relief.  Nothing else seems to make the symptoms better.  He reports self medication with EtOH and marijuana.  Pt reports drinking 5 beers tonight.  Pt reports no current headache, but other pain is present.  Pt denies fever, chills, neck pain, chest pain, SOB, abd pain, N/V, weakness, dizziness, syncope. No falls or known injury.     The history is provided by the patient, medical records and a significant other. No language interpreter was used.    Past Medical History:  Diagnosis Date  . Environmental allergies   . External hemorrhoid   . Heart murmur    asymptomatic    Patient Active Problem List   Diagnosis Date Noted  . Abdominal hernia 04/25/2013    Past Surgical History:  Procedure Laterality Date  . APPENDECTOMY  08-20-2009  . DX LAPAROSCOPY  /  LYSIS ADHESIONS/  OPEN ILEOCECAL RESECTION  08-28-2009   cecal perforation  . EVALUATION UNDER ANESTHESIA WITH HEMORRHOIDECTOMY N/A 03/12/2015   Procedure: EXAM UNDER ANESTHESIA WITH HEMORRHOIDECTOMY;  Surgeon: Axel Filler, MD;  Location: [facility];  Service: General;  Laterality: N/A;  . KNEE ARTHROSCOPY W/ MENISCECTOMY Right 07-10-2006        Home Medications    Prior to Admission medications   Medication Sig Start Date End Date Taking? Authorizing Provider  MAGNESIUM PO Take 1 tablet by mouth daily.   Yes [provider]  docusate sodium (COLACE) 100 MG capsule Take 1 capsule (100 mg total) by mouth 2 (two) times daily. Patient not taking: Reported on 09/02/2017 03/12/15   Axel Filler, MD  methocarbamol (ROBAXIN) 500 MG tablet Take 1 tablet (500 mg total) by mouth 2 (two) times daily. 09/02/17   Nainika Newlun, Dahlia Client, PA-C  oxyCODONE-acetaminophen (PERCOCET) 7.5-325 MG tablet Take 1 tablet by mouth every 4 (four) hours as needed for severe pain. Patient not taking: Reported on 09/02/2017 03/12/15   Axel Filler, MD    Family History Family History  Problem Relation Age of Onset  . Diabetes Father   . Hypertension Father     Social History Social History   Tobacco Use  . Smoking status: Never Smoker  . Smokeless tobacco: Never Used  Substance Use Topics  . Alcohol use: Yes    Comment: 12 beers on the weekends  . Drug use: No  Allergies   Latex   Review of Systems Review of Systems  Constitutional: Negative for appetite change, diaphoresis, fatigue, fever and unexpected weight change.  HENT: Negative for mouth sores.   Eyes: Negative for visual disturbance.  Respiratory: Negative for cough, chest tightness, shortness of breath and wheezing.   Cardiovascular: Negative for chest pain.  Gastrointestinal: Negative for abdominal pain, constipation, diarrhea, nausea and vomiting.  Endocrine: Negative for polydipsia, polyphagia and  polyuria.  Genitourinary: Negative for dysuria, frequency, hematuria and urgency.  Musculoskeletal: Positive for arthralgias, back pain and myalgias. Negative for neck stiffness.  Skin: Negative for rash.  Allergic/Immunologic: Negative for immunocompromised state.  Neurological: Positive for headaches. Negative for syncope and light-headedness.  Hematological: Does not bruise/bleed easily.  Psychiatric/Behavioral: Negative for sleep disturbance. The patient is not nervous/anxious.      Physical Exam Updated Vital Signs BP (!) 200/143 (BP Location: Right Arm)   Pulse (!) 108   Temp 98.2 F (36.8 C) (Oral)   Resp 20   Ht 5\' 9"  (1.753 m)   Wt 92.1 kg (203 lb)   SpO2 100%   BMI 29.98 kg/m   Physical Exam  Constitutional: He is oriented to person, place, and time. He appears well-developed and well-nourished. No distress.  Awake, alert, nontoxic appearance  HENT:  Head: Normocephalic and atraumatic.  Mouth/Throat: Mucous membranes are normal.  Eyes: Pupils are equal, round, and reactive to light. Conjunctivae and EOM are normal. No scleral icterus.  No horizontal, vertical or rotational nystagmus  Neck: Normal range of motion. Neck supple. No neck rigidity.  Cardiovascular: Normal rate, regular rhythm and intact distal pulses.  Pulses:      Radial pulses are 2+ on the right side, and 2+ on the left side.       Dorsalis pedis pulses are 2+ on the right side, and 2+ on the left side.  Pulmonary/Chest: Effort normal and breath sounds normal. No respiratory distress. He has no wheezes. He has no rales.  Equal chest expansion  Abdominal: Soft. Bowel sounds are normal. There is no tenderness. There is no rebound and no guarding.  Musculoskeletal: Normal range of motion. He exhibits no edema.  Moves all extremities without ataxia.  Neurological: He is alert and oriented to person, place, and time. No cranial nerve deficit. He exhibits normal muscle tone. Coordination normal.  Mental  Status:  Alert, oriented, thought content appropriate. Speech fluent without evidence of aphasia. Able to follow 2 step commands without difficulty.  Cranial Nerves:  II:  pupils equal, round, reactive to light III,IV, VI: ptosis not present, extra-ocular motions intact bilaterally  V,VII: smile symmetric,  VIII: hearing grossly normal bilaterally  IX,X: midline uvula rise  XI: bilateral shoulder shrug equal and strong XII: midline tongue extension  Motor:  5/5 in upper and lower extremities bilaterally including strong and equal grip strength and dorsiflexion/plantar flexion Sensory: normal to light touch in all extremities.  CV: distal pulses palpable throughout   Skin: Skin is warm and dry. He is not diaphoretic.  Psychiatric: He has a normal mood and affect. His behavior is normal. Judgment and thought content normal.  Nursing note and vitals reviewed.    ED Treatments / Results  Labs (all labs ordered are listed, but only abnormal results are displayed) Labs Reviewed  ETHANOL - Abnormal; Notable for the following components:      Result Value   Alcohol, Ethyl (B) 180 (*)    All other components within normal limits  URINALYSIS, ROUTINE W  REFLEX MICROSCOPIC - Abnormal; Notable for the following components:   Color, Urine COLORLESS (*)    Specific Gravity, Urine 1.001 (*)    Hgb urine dipstick SMALL (*)    All other components within normal limits  CBC  BASIC METABOLIC PANEL  RAPID URINE DRUG SCREEN, HOSP PERFORMED     Radiology Ct Head Wo Contrast  Result Date: 09/02/2017 CLINICAL DATA:  Headache since November. EXAM: CT HEAD WITHOUT CONTRAST TECHNIQUE: Contiguous axial images were obtained from the base of the skull through the vertex without intravenous contrast. COMPARISON:  None. FINDINGS: BRAIN: The ventricles and sulci are normal. No intraparenchymal hemorrhage, mass effect nor midline shift. No acute large vascular territory infarcts. Grey-white matter distinction  is maintained. The basal ganglia are unremarkable. No abnormal extra-axial fluid collections. Basal cisterns are not effaced and midline. The brainstem and cerebellar hemispheres are without acute abnormalities. VASCULAR: Unremarkable. SKULL/SOFT TISSUES: No skull fracture. No significant soft tissue swelling. ORBITS/SINUSES: The included ocular globes and orbital contents are normal.The mastoid air cells are clear. The included paranasal sinuses are well-aerated. OTHER: None. IMPRESSION: Normal head CT Electronically Signed   By: Tollie Ethavid  Kwon M.D.   On: 09/02/2017 03:15    Procedures Procedures (including critical care time)  Medications Ordered in ED Medications - No data to display   Initial Impression / Assessment and Plan / ED Course  I have reviewed the triage vital signs and the nursing notes.  Pertinent labs & imaging results that were available during my care of the patient were reviewed by me and considered in my medical decision making (see chart for details).  Clinical Course as of Sep 02 698  Sat Sep 02, 2017  0236 hypertensive  BP(!): 200/143 [HM]  0236 tachycardic  Pulse Rate(!): 108 [HM]  0430 BP improved without intervention  BP: 129/89 [HM]  0430 Tachycardia resolved  Pulse Rate: 96 [HM]    Clinical Course User Index [HM] Shabre Kreher, Dahlia ClientHannah, PA-C    Long discussion with patient about his chronic pain and the reasons for not giving narcotics.  He is amenable to this.  He has no history of liver disease and has not tried Tylenol for his pain.  We also discussed muscle relaxers for chronic back pain.  Discussed the importance of cessation of BC powder usage.  Also discussed the importance of not combining alcohol and Tylenol.  Patient has reassuring neurologic exam.  His headache is very atypical and not consistent with CVA.  On arrival, patient tachycardic and hypertensive however this had resolved completely and spontaneously by the time I evaluated the patient.   Patient reports he has had no difficulty walking and RN reports he has been ambulatory without difficulty.  Patient denies a history of migraine headaches.  CT scan is without acute abnormality.  I personally reviewed these images.  Presentation is non concerning for [facility], ICH, Meningitis, or temporal arteritis. Pt is afebrile with no focal neuro deficits, nuchal rigidity, or change in vision.  He has not had a headache at any point during his time in the emergency department.  Pt is to follow up with neurologist for further evaluation of his headaches.  Discussed with patient and wife reasons to return immediately to the emergency department.  They state understanding and are in agreement with the plan.  Final Clinical Impressions(s) / ED Diagnoses   Final diagnoses:  Chronic bilateral low back pain with sciatica, sciatica laterality unspecified  Acute nonintractable headache, unspecified headache type    ED  Discharge Orders        Ordered    methocarbamol (ROBAXIN) 500 MG tablet  2 times daily     09/02/17 0354       Ukiah Trawick, Boyd Kerbs 09/02/17 0701    Azalia Bilis, MD 09/02/17 [phone]

## 2018-06-04 ENCOUNTER — Telehealth: Payer: Self-pay | Admitting: Hematology

## 2018-06-04 NOTE — Telephone Encounter (Signed)
Spoke with patient to confirm new patient appt 06/21/18 at 1030 am

## 2018-06-15 ENCOUNTER — Telehealth: Payer: Self-pay | Admitting: Hematology

## 2018-06-15 NOTE — Telephone Encounter (Signed)
Called patient and tried to [facility] but NO VM has been set up at this time. Letter mailed with NEW appointment date/time has been mailed regarding r/s appt from 06/21/2018 to 06/28/2018 and this was per Dr Dion Body

## 2018-06-21 ENCOUNTER — Ambulatory Visit: Payer: Managed Care, Other (non HMO) | Admitting: Hematology

## 2018-06-21 ENCOUNTER — Other Ambulatory Visit: Payer: Managed Care, Other (non HMO)

## 2018-06-25 ENCOUNTER — Other Ambulatory Visit: Payer: Self-pay | Admitting: Hematology

## 2018-06-25 DIAGNOSIS — D72819 Decreased white blood cell count, unspecified: Secondary | ICD-10-CM | POA: Insufficient documentation

## 2018-06-25 DIAGNOSIS — D709 Neutropenia, unspecified: Secondary | ICD-10-CM

## 2018-06-25 NOTE — Progress Notes (Signed)
[facility] CONSULT NOTE  Patient Care Team: System, Provider Not In as PCP - General  HEME/ONC OVERVIEW: 1. Leukopenia with borderline neutropenia -05/2018: routine labs showed WBC 2.6k with ANC 980; normal Hgb and plts  ASSESSMENT & PLAN  Leukopenia with borderline neutropenia -I reviewed the patient's records in detail, including PCP clinic notes and lab studies -In summary, patient presented to PCP for routine health evaluation in 05/2018, and CBC showed an incidental mild leukopenia (WBC 2.6k with ANC 980); review of the patient's previous CBCs showed WBC's near the lower limit of normal with occasional leukopenia dating back to at least 2014 -Clinically, patient denies any increased frequency of infection or antibiotic use, or constitutional symptoms -WBC 2.8k with ANC 1100 today; normal Hgb and plts  -I reviewed the peripheral blood smear, which showed normal WBC morphology; there were no apparent dysplastic changes  -I have ordered viral and nutritional studies, including zinc, copper, B12 and folate (given the patient's hx of ileocecal resection)  -I discussed with regarding importance of significantly reducing his EtOH use, as it can cause bone marrow toxicities (see below) -Furthermore, WBC and neutrophil count can be lower in the African-American population, and in the absence of any clinically suspicious symptoms, no further work-up is generally required  -I suggested the patient to return in 1 month for repeat CBC to monitor WBC trend, but the patient declined and would like to see his PCP prior to his knee replacement  History of ileocecal resection -Given the history of ileocecal resection, patient is at increased risk for developing nutritional deficiencies, such as B12 -B12 level pending today -I encourage patient take an OTC B12 supplement daily  EtOH abuse -Patient reports drinking at least one 12-pack every weekend -I spent some time counseling the  patient on importance of significantly reducing his alcohol use, given its potential impact on the bone marrow -Patient expressed understanding, and agreed to reduce his alcohol use  Health counseling -I encouraged patient to be up-to-date with age-appropriate cancer screening, including colonoscopy  Orders Placed This Encounter  Procedures  . Vitamin B12    Standing Status:   Future    Number of Occurrences:   1    Standing Expiration Date:   08/02/2019  . Folate    Standing Status:   Future    Number of Occurrences:   1    Standing Expiration Date:   08/02/2019    All questions were answered. The patient knows to call the clinic with any problems, questions or concerns.  Return in 6 months for labs and clinic follow-up.   Arthur Holms, MD 06/28/2018 9:34 AM   CHIEF COMPLAINTS/PURPOSE OF CONSULTATION:  "I am here for low white blood cell count"  HISTORY OF PRESENTING ILLNESS:  [name] Saa Sr. 58 y.o. male is here because of low WBC.  He was found to have abnormal CBC from 05/2018.  He denies recent infection, such as cough, sinus congestion, sputum production, urinary urgency or dysuria, diarrhea, or abnormal skin rash. He denies any frequent antibiotic usage or history of severe infection requiring IV antibiotics or hospitalization. He had no prior history or diagnosis of cancer. His age appropriate screening programs are up-to-date. The patient has no prior diagnosis of autoimmune disease. There is no family history of blood disorder, such as leukemia or lymphoma. Patient reports drinking at least one 12 pack beer every weekend.  He denies history of smoking or illicit drug use. He takes meloxicam as needed (approximately  once a week) for the past 3 to 4 months.  He also takes PRN Tylenol for osteoarthritis. He is scheduled for right knee replacement on 08/06/2018 for osteoarthritis.  Of note, patient had lysis of adhesion and ileocecal resection in 2011.  MEDICAL  HISTORY:  Past Medical History:  Diagnosis Date  . Environmental allergies   . External hemorrhoid   . Heart murmur    asymptomatic    SURGICAL HISTORY: Past Surgical History:  Procedure Laterality Date  . APPENDECTOMY  08-20-2009  . DX LAPAROSCOPY /  LYSIS ADHESIONS/  OPEN ILEOCECAL RESECTION  08-28-2009   cecal perforation  . EVALUATION UNDER ANESTHESIA WITH HEMORRHOIDECTOMY N/A 03/12/2015   Procedure: EXAM UNDER ANESTHESIA WITH HEMORRHOIDECTOMY;  Surgeon: Axel FillerArmando Ramirez, MD;  [facility];  Service: General;  Laterality: N/A;  . KNEE ARTHROSCOPY W/ MENISCECTOMY Right 07-10-2006    SOCIAL HISTORY: Social History   Socioeconomic History  . Marital status: Married    Spouse name: Not on file  . Number of children: 3  . Years of education: Not on file  . Highest education level: Not on file  Occupational History  . Occupation: MS/MS LC operator    Employer: [employer]  Social Needs  . Financial resource strain: Not on file  . Food insecurity:    Worry: Not on file    Inability: Not on file  . Transportation needs:    Medical: Not on file    Non-medical: Not on file  Tobacco Use  . Smoking status: Never Smoker  . Smokeless tobacco: Never Used  Substance and Sexual Activity  . Alcohol use: Yes    Comment: 12 beers on the weekends  . Drug use: No  . Sexual activity: Not on file  Lifestyle  . Physical activity:    Days per week: Not on file    Minutes per session: Not on file  . Stress: Not on file  Relationships  . Social connections:    Talks on phone: Not on file    Gets together: Not on file    Attends religious service: Not on file    Active member of club or organization: Not on file    Attends meetings of clubs or organizations: Not on file    Relationship status: Not on file  . Intimate partner violence:    Fear of current or ex partner: Not on file    Emotionally abused: Not on file    Physically abused: Not on file    Forced  sexual activity: Not on file  Other Topics Concern  . Not on file  Social History Narrative  . Not on file    FAMILY HISTORY: Family History  Problem Relation Age of Onset  . Diabetes Father   . Hypertension Father     ALLERGIES:  is allergic to latex.  MEDICATIONS:  Current Outpatient Medications  Medication Sig Dispense Refill  . acetaminophen (TYLENOL) 500 MG tablet Take 500 mg by mouth every 6 (six) hours as needed for moderate pain (bilateral knee pain, right hip bain, back pain. May take 500mg  up to 2,000 mg as needed.).    Marland Kitchen. amLODipine (NORVASC) 10 MG tablet Take 10 mg by mouth daily.    Marland Kitchen. MAGNESIUM PO Take 1 tablet by mouth daily.    . meloxicam (MOBIC) 15 MG tablet Take 1 tablet by mouth daily.    . montelukast (SINGULAIR) 10 MG tablet Take 1 tablet by mouth daily as needed.    .Marland Kitchen  triamterene-hydrochlorothiazide (MAXZIDE-25) 37.5-25 MG tablet Take 1 tablet by mouth daily.    Marland Kitchen albuterol (VENTOLIN HFA) 108 (90 Base) MCG/ACT inhaler Take 1 spray by mouth as directed.    . docusate sodium (COLACE) 100 MG capsule Take 1 capsule (100 mg total) by mouth 2 (two) times daily. (Patient not taking: Reported on 06/28/2018) 60 capsule 0  . methocarbamol (ROBAXIN) 500 MG tablet Take 1 tablet (500 mg total) by mouth 2 (two) times daily. (Patient not taking: Reported on 06/28/2018) 20 tablet 0  . oxyCODONE-acetaminophen (PERCOCET) 7.5-325 MG tablet Take 1 tablet by mouth every 4 (four) hours as needed for severe pain. (Patient not taking: Reported on 09/02/2017) 30 tablet 0   No current facility-administered medications for this visit.     REVIEW OF SYSTEMS:   Constitutional: ( - ) fevers, ( - )  chills , ( - ) night sweats Eyes: ( - ) blurriness of vision, ( - ) double vision, ( - ) watery eyes Ears, nose, mouth, throat, and face: ( - ) mucositis, ( - ) sore throat Respiratory: ( - ) cough, ( - ) dyspnea, ( - ) wheezes Cardiovascular: ( - ) palpitation, ( - ) chest discomfort, ( - )  lower extremity swelling Gastrointestinal:  ( - ) nausea, ( - ) heartburn, ( - ) change in bowel habits Skin: ( - ) abnormal skin rashes Lymphatics: ( - ) new lymphadenopathy, ( - ) easy bruising Neurological: ( - ) numbness, ( - ) tingling, ( - ) new weaknesses Behavioral/Psych: ( - ) mood change, ( - ) new changes  All other systems were reviewed with the patient and are negative.  PHYSICAL EXAMINATION: ECOG PERFORMANCE STATUS: 1 - Symptomatic but completely ambulatory  Vitals:   06/28/18 0858  BP: (!) 149/96  Pulse: 70  Resp: 18  Temp: 97.8 F (36.6 C)  SpO2: 100%   Filed Weights   06/28/18 0858  Weight: 208 lb 12.8 oz (94.7 kg)    GENERAL: alert, no distress and comfortable SKIN: skin color, texture, turgor are normal, no rashes or significant lesions EYES: conjunctiva are pink and non-injected, sclera clear OROPHARYNX: no exudate, no erythema; lips, buccal mucosa, and tongue normal  NECK: supple, non-tender LYMPH:  no palpable lymphadenopathy in the cervical or axillary  LUNGS: clear to auscultation and percussion with normal breathing effort HEART: regular rate & rhythm and no murmurs and no lower extremity edema ABDOMEN: soft, non-tender, non-distended, normal bowel sounds Musculoskeletal: no cyanosis of digits and no clubbing  PSYCH: alert & oriented x 3, fluent speech NEURO: no focal motor/sensory deficits  LABORATORY DATA:  I have reviewed the data as listed Recent Results (from the past 2160 hour(s))  CBC with Differential (Cancer Center Only)     Status: Abnormal   Collection Time: 06/28/18  8:37 AM  Result Value Ref Range   WBC Count 2.8 (L) 4.0 - 10.5 K/uL   RBC 5.38 4.22 - 5.81 MIL/uL   Hemoglobin 15.9 13.0 - 17.0 g/dL   HCT 16.1 09.6 - 04.5 %   MCV 89.2 80.0 - 100.0 fL   MCH 29.6 26.0 - 34.0 pg   MCHC 33.1 30.0 - 36.0 g/dL   RDW 40.9 81.1 - 91.4 %   Platelet Count 210 150 - 400 K/uL   nRBC 0.0 0.0 - 0.2 %   Neutrophils Relative % 41 %   Neutro  Abs 1.1 (L) 1.7 - 7.7 K/uL   Lymphocytes Relative 45 %  Lymphs Abs 1.3 0.7 - 4.0 K/uL   Monocytes Relative 11 %   Monocytes Absolute 0.3 0.1 - 1.0 K/uL   Eosinophils Relative 2 %   Eosinophils Absolute 0.1 0.0 - 0.5 K/uL   Basophils Relative 1 %   Basophils Absolute 0.0 0.0 - 0.1 K/uL   Immature Granulocytes 0 %   Abs Immature Granulocytes 0.00 0.00 - 0.07 K/uL    Comment: Performed at [facility] at [facility] [place], [address], [place], [place] [address]  CMP (Cancer Center only)     Status: Abnormal   Collection Time: 06/28/18  8:37 AM  Result Value Ref Range   Sodium 141 135 - 145 mmol/L   Potassium 3.4 (L) 3.5 - 5.1 mmol/L   Chloride 102 98 - 111 mmol/L   CO2 32 22 - 32 mmol/L   Glucose, Bld 99 70 - 99 mg/dL   BUN 18 6 - 20 mg/dL   Creatinine 6.041.09 5.400.61 - 1.24 mg/dL   Calcium 9.6 8.9 - 98.110.3 mg/dL   Total Protein 7.4 6.5 - 8.1 g/dL   Albumin 4.9 3.5 - 5.0 g/dL   AST 25 15 - 41 U/L   ALT 26 0 - 44 U/L   Alkaline Phosphatase 51 38 - 126 U/L   Total Bilirubin 0.8 0.3 - 1.2 mg/dL   GFR, Est Non Af Am >19>60 >60 mL/min   GFR, Est AFR Am >60 >60 mL/min   Anion gap 7 5 - 15    Comment: Performed at [facility] at [facility] [place], [address], [place], [place] [address]  Save Smear [facility])     Status: None   Collection Time: 06/28/18  8:37 AM  Result Value Ref Range   Smear Review SMEAR STAINED AND AVAILABLE FOR REVIEW     Comment: Performed at [facility] at [facility] [place], [address], [place], [place] [address]   I personally reviewed the patient's peripheral blood smear today.  The red blood cells were of normal morphology.  There was no schistocytosis.  The white blood cells were of normal morphology. There were no peripheral circulating blasts. The platelets were of normal size and I verified that there were no platelet clumping.

## 2018-06-28 ENCOUNTER — Encounter: Payer: Self-pay | Admitting: Hematology

## 2018-06-28 ENCOUNTER — Inpatient Hospital Stay (HOSPITAL_BASED_OUTPATIENT_CLINIC_OR_DEPARTMENT_OTHER): Payer: BLUE CROSS/BLUE SHIELD | Admitting: Hematology

## 2018-06-28 ENCOUNTER — Inpatient Hospital Stay: Payer: BLUE CROSS/BLUE SHIELD | Attending: Hematology

## 2018-06-28 VITALS — BP 149/96 | HR 70 | Temp 97.8°F | Resp 18 | Ht 69.0 in | Wt 208.8 lb

## 2018-06-28 DIAGNOSIS — Z791 Long term (current) use of non-steroidal anti-inflammatories (NSAID): Secondary | ICD-10-CM

## 2018-06-28 DIAGNOSIS — I10 Essential (primary) hypertension: Secondary | ICD-10-CM

## 2018-06-28 DIAGNOSIS — Z8249 Family history of ischemic heart disease and other diseases of the circulatory system: Secondary | ICD-10-CM | POA: Insufficient documentation

## 2018-06-28 DIAGNOSIS — F1010 Alcohol abuse, uncomplicated: Secondary | ICD-10-CM

## 2018-06-28 DIAGNOSIS — Z79899 Other long term (current) drug therapy: Secondary | ICD-10-CM | POA: Diagnosis not present

## 2018-06-28 DIAGNOSIS — D709 Neutropenia, unspecified: Secondary | ICD-10-CM

## 2018-06-28 DIAGNOSIS — Z9889 Other specified postprocedural states: Secondary | ICD-10-CM

## 2018-06-28 DIAGNOSIS — Z9049 Acquired absence of other specified parts of digestive tract: Secondary | ICD-10-CM | POA: Insufficient documentation

## 2018-06-28 LAB — CMP (CANCER CENTER ONLY)
ALT: 26 U/L (ref 0–44)
AST: 25 U/L (ref 15–41)
Albumin: 4.9 g/dL (ref 3.5–5.0)
Alkaline Phosphatase: 51 U/L (ref 38–126)
Anion gap: 7 (ref 5–15)
BUN: 18 mg/dL (ref 6–20)
CHLORIDE: 102 mmol/L (ref 98–111)
CO2: 32 mmol/L (ref 22–32)
CREATININE: 1.09 mg/dL (ref 0.61–1.24)
Calcium: 9.6 mg/dL (ref 8.9–10.3)
GFR, Est AFR Am: >60 mL/min (ref >60)
GFR, Estimated: >60 mL/min (ref >60)
Glucose, Bld: 99 mg/dL (ref 70–99)
Potassium: 3.4 mmol/L — ABNORMAL LOW (ref 3.5–5.1)
Sodium: 141 mmol/L (ref 135–145)
Total Bilirubin: 0.8 mg/dL (ref 0.3–1.2)
Total Protein: 7.4 g/dL (ref 6.5–8.1)

## 2018-06-28 LAB — CBC WITH DIFFERENTIAL (CANCER CENTER ONLY)
Abs Immature Granulocytes: 0 10*3/uL (ref 0.00–0.07)
BASOS PCT: 1 %
Basophils Absolute: 0 10*3/uL (ref 0.0–0.1)
Eosinophils Absolute: 0.1 10*3/uL (ref 0.0–0.5)
Eosinophils Relative: 2 %
HCT: 48 % (ref 39.0–52.0)
Hemoglobin: 15.9 g/dL (ref 13.0–17.0)
Immature Granulocytes: 0 %
Lymphocytes Relative: 45 %
Lymphs Abs: 1.3 10*3/uL (ref 0.7–4.0)
MCH: 29.6 pg (ref 26.0–34.0)
MCHC: 33.1 g/dL (ref 30.0–36.0)
MCV: 89.2 fL (ref 80.0–100.0)
Monocytes Absolute: 0.3 10*3/uL (ref 0.1–1.0)
Monocytes Relative: 11 %
Neutro Abs: 1.1 10*3/uL — ABNORMAL LOW (ref 1.7–7.7)
Neutrophils Relative %: 41 %
Platelet Count: 210 10*3/uL (ref 150–400)
RBC: 5.38 MIL/uL (ref 4.22–5.81)
RDW: 13.5 % (ref 11.5–15.5)
WBC Count: 2.8 10*3/uL — ABNORMAL LOW (ref 4.0–10.5)
nRBC: 0 % (ref 0.0–0.2)

## 2018-06-28 LAB — SAVE SMEAR(SSMR), FOR PROVIDER SLIDE REVIEW

## 2018-06-28 LAB — FOLATE: Folate: 11.7 ng/mL (ref >5.9)

## 2018-06-28 LAB — VITAMIN B12: Vitamin B-12: 267 pg/mL (ref 180–914)

## 2018-06-28 LAB — LACTATE DEHYDROGENASE: LDH: 224 U/L — ABNORMAL HIGH (ref 98–192)

## 2018-06-29 LAB — HEPATITIS B SURFACE ANTIGEN: Hepatitis B Surface Ag: NEGATIVE

## 2018-06-29 LAB — HCV COMMENT:

## 2018-06-29 LAB — HEPATITIS B SURFACE ANTIBODY,QUALITATIVE: Hep B S Ab: REACTIVE

## 2018-06-29 LAB — HEPATITIS C ANTIBODY (REFLEX): HCV Ab: 0.1 s/co ratio (ref 0.0–0.9)

## 2018-06-29 LAB — HIV ANTIBODY (ROUTINE TESTING W REFLEX): HIV Screen 4th Generation wRfx: NONREACTIVE

## 2018-06-30 LAB — COPPER, SERUM: Copper: 88 ug/dL (ref 72–166)

## 2018-06-30 LAB — ZINC: Zinc: 86 ug/dL (ref 56–134)

## 2018-07-25 NOTE — H&P (Signed)
TOTAL KNEE ADMISSION H&P  Patient is being admitted for right total knee arthroplasty.  Subjective:  Chief Complaint:right knee pain.  HPI: [name] Killeen Sr., 58 y.o. male, has a history of pain and functional disability in the right knee due to arthritis and has failed non-surgical conservative treatments for greater than 12 weeks to includeviscosupplementation injections and activity modification.  Onset of symptoms was gradual, starting several years ago with gradually worsening course since that time. The patient noted no past surgery on the right knee(s).  Patient currently rates pain in the right knee(s) at 7 out of 10 with activity. Patient has worsening of pain with activity and weight bearing and crepitus.  Patient has evidence of tri-compartmental bone-on-bone arthritis by imaging studies. There is no active infection.  Patient Active Problem List   Diagnosis Date Noted  . 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 ETOH abuse 06/28/2018  . Leukopenia 06/25/2018  . Abdominal hernia 04/25/2013   Past Medical History:  Diagnosis Date  . Environmental allergies   . External hemorrhoid   . Heart murmur    asymptomatic    Past Surgical History:  Procedure Laterality Date  . APPENDECTOMY  08-20-2009  . DX LAPAROSCOPY /  LYSIS ADHESIONS/  OPEN ILEOCECAL RESECTION  08-28-2009   cecal perforation  . EVALUATION UNDER ANESTHESIA WITH HEMORRHOIDECTOMY N/A 03/12/2015   Procedure: EXAM UNDER ANESTHESIA WITH HEMORRHOIDECTOMY;  Surgeon: Axel Filler, MD;  Location: [facility];  Service: General;  Laterality: N/A;  . KNEE ARTHROSCOPY W/ MENISCECTOMY Right 07-10-2006    No current facility-administered medications for this encounter.    Current Outpatient Medications  Medication Sig Dispense Refill Last Dose  . acetaminophen (TYLENOL) 500 MG tablet Take 2,000 mg by mouth every 6 (six) hours as needed for moderate pain or headache.    Taking  . albuterol  (VENTOLIN HFA) 108 (90 Base) MCG/ACT inhaler Take 1 puff by mouth every 6 (six) hours as needed for wheezing or shortness of breath.    Not Taking  . amLODipine (NORVASC) 10 MG tablet Take 10 mg by mouth daily.   Taking  . Cyanocobalamin (VITAMIN B-12 PO) Take 1 tablet by mouth daily.     . Liniments (DEEP BLUE RELIEF EX) Apply 1 application topically daily as needed (for back and knee pain).     . meloxicam (MOBIC) 15 MG tablet Take 15 mg by mouth daily as needed for pain.    Taking  . triamterene-hydrochlorothiazide (MAXZIDE-25) 37.5-25 MG tablet Take 1 tablet by mouth daily.   Taking   Allergies  Allergen Reactions  . Latex Rash    Social History   Tobacco Use  . Smoking status: Never Smoker  . Smokeless tobacco: Never Used  Substance Use Topics  . Alcohol use: Yes    Comment: 12 beers on the weekends    Family History  Problem Relation Age of Onset  . Diabetes Father   . Hypertension Father      Review of Systems  Constitutional: Negative for chills and fever.  HENT: Negative for congestion, sore throat and tinnitus.   Eyes: Negative for double vision, photophobia and pain.  Respiratory: Negative for cough, shortness of breath and wheezing.   Cardiovascular: Negative for chest pain, palpitations and orthopnea.  Gastrointestinal: Negative for heartburn, nausea and vomiting.  Genitourinary: Negative for dysuria, frequency and urgency.  Musculoskeletal: Positive for joint pain.  Neurological: Negative for dizziness, weakness and headaches.    Objective:  Physical Exam  Well nourished and well developed.  General: Alert and oriented x3, cooperative and pleasant, no acute distress.  Head: normocephalic, atraumatic, neck supple.  Eyes: EOMI.  Respiratory: breath sounds clear in all fields, no wheezing, rales, or rhonchi. Cardiovascular: Regular rate and rhythm, no murmurs, gallops or rubs.  Abdomen: non-tender to palpation and soft, normoactive bowel  sounds. Musculoskeletal:  Right Knee Exam: No effusion. Range of motion is 5-120 degrees. Marked crepitus on range of motion of the knee. Slight medial greater than lateral joint line tenderness. Stable knee.  Calves soft and nontender. Motor function intact in LE. Strength 5/5 LE bilaterally. Neuro: Distal pulses 2+. Sensation to light touch intact in LE.  Labs:   Estimated body mass index is 30.83 kg/m as calculated from the following:   Height as of 06/28/18: 5\' 9"  (1.753 m).   Weight as of 06/28/18: 94.7 kg.   Imaging Review Plain radiographs demonstrate severe degenerative joint disease of the right knee(s). The overall alignment issignificant varus. The bone quality appears to be adequate for age and reported activity level.      Assessment/Plan:  End stage arthritis, right knee   The patient history, physical examination, clinical judgment of the provider and imaging studies are consistent with end stage degenerative joint disease of the right knee(s) and total knee arthroplasty is deemed medically necessary. The treatment options including medical management, injection therapy arthroscopy and arthroplasty were discussed at length. The risks and benefits of total knee arthroplasty were presented and reviewed. The risks due to aseptic loosening, infection, stiffness, patella tracking problems, thromboembolic complications and other imponderables were discussed. The patient acknowledged the explanation, agreed to proceed with the plan and consent was signed. Patient is being admitted for inpatient treatment for surgery, pain control, PT, OT, prophylactic antibiotics, VTE prophylaxis, progressive ambulation and ADL's and discharge planning. The patient is planning to be discharged home.    Anticipated LOS equal to or greater than 2 midnights due to - Age 61 and older with one or more of the following:  - Obesity  - Expected need for hospital services (PT, OT, Nursing) required  for safe  discharge  - Anticipated need for postoperative skilled nursing care or inpatient rehab  - Active co-morbidities: None OR   - Unanticipated findings during/Post Surgery: None  - Patient is a high risk of re-admission due to: None   Therapy Plans: outpatient therapy (will send with print out - undecided on location) Disposition: Home with wife Planned DVT Prophylaxis: Aspirin 325 mg BID DME needed: Dan Humphreys PCP: Lindaann Pascal, PA-C [facility]) TXA: IV Allergies: NKDA Anesthesia Concerns: None BMI: 30.2  - Patient was instructed on what medications to stop prior to surgery. - Follow-up visit in 2 weeks with Dr. Lequita Halt - Begin physical therapy following surgery - Pre-operative lab work as pre-surgical testing - Prescriptions will be provided in hospital at time of discharge  Arther Abbott, PA-C Orthopedic Surgery [facility] [place]

## 2018-07-30 NOTE — Patient Instructions (Signed)
[name] Lubinski Sr.  07/30/2018   Your procedure is scheduled on: Monday 08/06/2018  Report to [facility] Main  Entrance              Report to admitting at  0905  AM    Call this number if you have problems the morning of surgery [phone]    Remember: Do not eat food or drink liquids :After Midnight.              BRUSH YOUR TEETH MORNING OF SURGERY AND RINSE YOUR MOUTH OUT, NO CHEWING GUM CANDY OR MINTS.     Take these medicines the morning of surgery with A SIP OF WATER: Amlodipine (Norvasc), use Albuterol inhaler if needed and bring inhaler with you to the hospital                                  You may not have any metal on your body including hair pins and              piercings  Do not wear jewelry, make-up, lotions, powders or perfumes, deodorant                        Men may shave face and neck.   Do not bring valuables to the hospital. [place] IS NOT             RESPONSIBLE   FOR VALUABLES.  Contacts, dentures or bridgework may not be worn into surgery.  Leave suitcase in the car. After surgery it may be brought to your room.                  Please read over the following fact sheets you were given: _____________________________________________________________________             [facility] - Preparing for Surgery Before surgery, you can play an important role.  Because skin is not sterile, your skin needs to be as free of germs as possible.  You can reduce the number of germs on your skin by washing with CHG (chlorahexidine gluconate) soap before surgery.  CHG is an antiseptic cleaner which kills germs and bonds with the skin to continue killing germs even after washing. Please DO NOT use if you have an allergy to CHG or antibacterial soaps.  If your skin becomes reddened/irritated stop using the CHG and inform your nurse when you arrive at Short Stay. Do not shave (including legs and underarms) for at least 48 hours prior to  the first CHG shower.  You may shave your face/neck. Please follow these instructions carefully:  1.  Shower with CHG Soap the night before surgery and the  morning of Surgery.  2.  If you choose to wash your hair, wash your hair first as usual with your  normal  shampoo.  3.  After you shampoo, rinse your hair and body thoroughly to remove the  shampoo.                           4.  Use CHG as you would any other liquid soap.  You can apply chg directly  to the skin and wash  Gently with a scrungie or clean washcloth.  5.  Apply the CHG Soap to your body ONLY FROM THE NECK DOWN.   Do not use on face/ open                           Wound or open sores. Avoid contact with eyes, ears mouth and genitals (private parts).                       Wash face,  Genitals (private parts) with your normal soap.             6.  Wash thoroughly, paying special attention to the area where your surgery  will be performed.  7.  Thoroughly rinse your body with warm water from the neck down.  8.  DO NOT shower/wash with your normal soap after using and rinsing off  the CHG Soap.                9.  Pat yourself dry with a clean towel.            10.  Wear clean pajamas.            11.  Place clean sheets on your bed the night of your first shower and do not  sleep with pets. Day of Surgery : Do not apply any lotions/deodorants the morning of surgery.  Please wear clean clothes to the hospital/surgery center.  FAILURE TO FOLLOW THESE INSTRUCTIONS MAY RESULT IN THE CANCELLATION OF YOUR SURGERY PATIENT SIGNATURE_________________________________  NURSE SIGNATURE__________________________________  ________________________________________________________________________   Adam Phenix  An incentive spirometer is a tool that can help keep your lungs clear and active. This tool measures how well you are filling your lungs with each breath. Taking long deep breaths may help reverse or  decrease the chance of developing breathing (pulmonary) problems (especially infection) following:  A long period of time when you are unable to move or be active. BEFORE THE PROCEDURE   If the spirometer includes an indicator to show your best effort, your nurse or respiratory therapist will set it to a desired goal.  If possible, sit up straight or lean slightly forward. Try not to slouch.  Hold the incentive spirometer in an upright position. INSTRUCTIONS FOR USE  1. Sit on the edge of your bed if possible, or sit up as far as you can in bed or on a chair. 2. Hold the incentive spirometer in an upright position. 3. Breathe out normally. 4. Place the mouthpiece in your mouth and seal your lips tightly around it. 5. Breathe in slowly and as deeply as possible, raising the piston or the ball toward the top of the column. 6. Hold your breath for 3-5 seconds or for as long as possible. Allow the piston or ball to fall to the bottom of the column. 7. Remove the mouthpiece from your mouth and breathe out normally. 8. Rest for a few seconds and repeat Steps 1 through 7 at least 10 times every 1-2 hours when you are awake. Take your time and take a few normal breaths between deep breaths. 9. The spirometer may include an indicator to show your best effort. Use the indicator as a goal to work toward during each repetition. 10. After each set of 10 deep breaths, practice coughing to be sure your lungs are clear. If you have an incision (the cut made at the time of surgery),  support your incision when coughing by placing a pillow or rolled up towels firmly against it. Once you are able to get out of bed, walk around indoors and cough well. You may stop using the incentive spirometer when instructed by your caregiver.  RISKS AND COMPLICATIONS  Take your time so you do not get dizzy or light-headed.  If you are in pain, you may need to take or ask for pain medication before doing incentive spirometry.  It is harder to take a deep breath if you are having pain. AFTER USE  Rest and breathe slowly and easily.  It can be helpful to keep track of a log of your progress. Your caregiver can provide you with a simple table to help with this. If you are using the spirometer at home, follow these instructions: [place] IF:   You are having difficultly using the spirometer.  You have trouble using the spirometer as often as instructed.  Your pain medication is not giving enough relief while using the spirometer.  You develop fever of 100.5 F (38.1 C) or higher. SEEK IMMEDIATE MEDICAL CARE IF:   You cough up bloody sputum that had not been present before.  You develop fever of 102 F (38.9 C) or greater.  You develop worsening pain at or near the incision site. MAKE SURE YOU:   Understand these instructions.  Will watch your condition.  Will get help right away if you are not doing well or get worse. Document Released: 10/10/2006 Document Revised: 08/22/2011 Document Reviewed: 12/11/2006 Exit[employer] Patient Information 2014 [employer], [place].   ________________________________________________________________________  WHAT IS A BLOOD TRANSFUSION? Blood Transfusion Information  A transfusion is the replacement of blood or some of its parts. Blood is made up of multiple cells which provide different functions.  Red blood cells carry oxygen and are used for blood loss replacement.  White blood cells fight against infection.  Platelets control bleeding.  Plasma helps clot blood.  Other blood products are available for specialized needs, such as hemophilia or other clotting disorders. BEFORE THE TRANSFUSION  Who gives blood for transfusions?   Healthy volunteers who are fully evaluated to make sure their blood is safe. This is blood bank blood. Transfusion therapy is the safest it has ever been in the practice of medicine. Before blood is taken from a donor, a complete  history is taken to make sure that person has no history of diseases nor engages in risky social behavior (examples are intravenous drug use or sexual activity with multiple partners). The donor's travel history is screened to minimize risk of transmitting infections, such as malaria. The donated blood is tested for signs of infectious diseases, such as HIV and hepatitis. The blood is then tested to be sure it is compatible with you in order to minimize the chance of a transfusion reaction. If you or a relative donates blood, this is often done in anticipation of surgery and is not appropriate for emergency situations. It takes many days to process the donated blood. RISKS AND COMPLICATIONS Although transfusion therapy is very safe and saves many lives, the main dangers of transfusion include:   Getting an infectious disease.  Developing a transfusion reaction. This is an allergic reaction to something in the blood you were given. Every precaution is taken to prevent this. The decision to have a blood transfusion has been considered carefully by your caregiver before blood is given. Blood is not given unless the benefits outweigh the risks. AFTER THE TRANSFUSION  Right after receiving a blood transfusion, you will usually feel much better and more energetic. This is especially true if your red blood cells have gotten low (anemic). The transfusion raises the level of the red blood cells which carry oxygen, and this usually causes an energy increase.  The nurse administering the transfusion will monitor you carefully for complications. HOME CARE INSTRUCTIONS  No special instructions are needed after a transfusion. You may find your energy is better. Speak with your caregiver about any limitations on activity for underlying diseases you may have. SEEK MEDICAL CARE IF:   Your condition is not improving after your transfusion.  You develop redness or irritation at the intravenous (IV) site. SEEK  IMMEDIATE MEDICAL CARE IF:  Any of the following symptoms occur over the next 12 hours:  Shaking chills.  You have a temperature by mouth above 102 F (38.9 C), not controlled by medicine.  Chest, back, or muscle pain.  People around you feel you are not acting correctly or are confused.  Shortness of breath or difficulty breathing.  Dizziness and fainting.  You get a rash or develop hives.  You have a decrease in urine output.  Your urine turns a dark color or changes to pink, red, or brown. Any of the following symptoms occur over the next 10 days:  You have a temperature by mouth above 102 F (38.9 C), not controlled by medicine.  Shortness of breath.  Weakness after normal activity.  The white part of the eye turns yellow (jaundice).  You have a decrease in the amount of urine or are urinating less often.  Your urine turns a dark color or changes to pink, red, or brown. Document Released: 05/27/2000 Document Revised: 08/22/2011 Document Reviewed: 01/14/2008 [facility] Patient Information 2014 [place], [place].  _______________________________________________________________________

## 2018-07-31 ENCOUNTER — Encounter (HOSPITAL_COMMUNITY): Payer: Self-pay

## 2018-07-31 ENCOUNTER — Other Ambulatory Visit: Payer: Self-pay

## 2018-07-31 ENCOUNTER — Encounter (HOSPITAL_COMMUNITY)
Admission: RE | Admit: 2018-07-31 | Discharge: 2018-07-31 | Disposition: A | Discharge status: Home / Self Care | Payer: BLUE CROSS/BLUE SHIELD | Source: Ambulatory Visit | Attending: Orthopedic Surgery | Admitting: Orthopedic Surgery

## 2018-07-31 DIAGNOSIS — I10 Essential (primary) hypertension: Secondary | ICD-10-CM | POA: Insufficient documentation

## 2018-07-31 DIAGNOSIS — Z01818 Encounter for other preprocedural examination: Secondary | ICD-10-CM | POA: Diagnosis present

## 2018-07-31 HISTORY — DX: Unspecified osteoarthritis, unspecified site: M19.90

## 2018-07-31 HISTORY — DX: Essential (primary) hypertension: I10

## 2018-07-31 LAB — COMPREHENSIVE METABOLIC PANEL
ALT: 34 U/L (ref 0–44)
AST: 34 U/L (ref 15–41)
Albumin: 4.7 g/dL (ref 3.5–5.0)
Alkaline Phosphatase: 46 U/L (ref 38–126)
Anion gap: 10 (ref 5–15)
BUN: 17 mg/dL (ref 6–20)
CO2: 28 mmol/L (ref 22–32)
Calcium: 9.6 mg/dL (ref 8.9–10.3)
Chloride: 103 mmol/L (ref 98–111)
Creatinine, Ser: 1.04 mg/dL (ref 0.61–1.24)
GFR calc non Af Amer: >60 mL/min (ref >60)
GLUCOSE: 96 mg/dL (ref 70–99)
Potassium: 4.3 mmol/L (ref 3.5–5.1)
Sodium: 141 mmol/L (ref 135–145)
Total Bilirubin: 1 mg/dL (ref 0.3–1.2)
Total Protein: 7.8 g/dL (ref 6.5–8.1)

## 2018-07-31 LAB — CBC
HCT: 46.4 % (ref 39.0–52.0)
Hemoglobin: 15.2 g/dL (ref 13.0–17.0)
MCH: 29.2 pg (ref 26.0–34.0)
MCHC: 32.8 g/dL (ref 30.0–36.0)
MCV: 89.1 fL (ref 80.0–100.0)
Platelets: 228 10*3/uL (ref 150–400)
RBC: 5.21 MIL/uL (ref 4.22–5.81)
RDW: 13.3 % (ref 11.5–15.5)
WBC: 3 10*3/uL — ABNORMAL LOW (ref 4.0–10.5)
nRBC: 0 % (ref 0.0–0.2)

## 2018-07-31 LAB — SURGICAL PCR SCREEN
MRSA, PCR: NEGATIVE
Staphylococcus aureus: NEGATIVE

## 2018-07-31 LAB — ABO/RH: ABO/RH(D): A NEG

## 2018-07-31 LAB — PROTIME-INR
INR: 0.96
Prothrombin Time: 12.6 seconds (ref 11.4–15.2)

## 2018-07-31 LAB — APTT: APTT: 28 s (ref 24–36)

## 2018-07-31 NOTE — Progress Notes (Signed)
Anesthesia Chart Review   Case:  [id number] Date/Time:  08/06/18 1120   Procedure:  TOTAL KNEE ARTHROPLASTY (Right )   Anesthesia type:  Choice   Pre-op diagnosis:  right knee osteoarthritis   Location:  [facility] 09 / [facility]   Surgeon:  Ollen Gross, MD      DISCUSSION:58 yo never smoker with h/o HTN, asymptomatic heart murmur, right knee OA scheduled for above procedure 08/06/18 with Dr. Ollen Gross.   Pt seen by cardiology 06/26/18.  Seen by Dr. Florian Buff.  Per her note, "Results of stress nuclear scans and echocardiogram were explained to the patient and he was assured.  Stress nuclear scans did not reveal any ischemia.  Calculated LVEF was low on nuclear scans but it appeared normal visually.  Also, LVEF was normal on echocardiogram.  Patient can have knee surgery from cardiac standpoint, his risks are low."  Pt can proceed with planned procedure barring acute status change.  VS: BP (!) 140/94   Pulse 80   Temp 36.7 C (Oral)   Resp 18   Ht 5\' 9"  (1.753 m)   Wt 94.4 kg   SpO2 97%   BMI 30.75 kg/m   PROVIDERS: System, Provider Not In  Florian Buff, MD is Cardiologist  LABS: Labs reviewed: Acceptable for surgery. (all labs ordered are listed, but only abnormal results are displayed)  Labs Reviewed  CBC - Abnormal; Notable for the following components:      Result Value   WBC 3.0 (*)    All other components within normal limits  SURGICAL PCR SCREEN  APTT  COMPREHENSIVE METABOLIC PANEL  PROTIME-INR  TYPE AND SCREEN  ABO/RH     IMAGES:   EKG: 06/01/18 (on chart) Rate 73 bpm NSR, Normal ECG  CV: Echo 06/20/2018 Findings: Left ventricle cavity is normal in size.  Normal left ventricular shape.  Normal global wall motion.  Normal diastolic filling pattern.  Left atrial cavity is normal in size Right atrial cavity is normal in size Right ventricle cavity is normal in size.  Normal right ventricular function.  Structurally normal trileaflet aortic valve with  no regurgitation noted.  Structurally normal mitral valve with no regurgitation noted Structurally normal tricuspid valvel with trace regurgitation Structurally normal pulmonic valve with trace regurgitation No evidence of significant pericardial effusion The aortic root is normal  Normal pulmonary artery IVC is normal with respiratory variation.   Stress Test 06/08/18 Impression  1.  Lexiscan stress test was performed.  Exercise capacity was not assessed.  Stress symptoms included dizziness, stomach pain and chest pressure.  Peak effect blood pressure was 164/90 mmHg.  The resting and stress electrocardiogram demonstrated normal sinus rhythm, normal resting conduction, no resting arrhythmias and normal rest repolarization.  Stress EKG is non diagnostic for ischemia as it is a pharmacologic stress.   2.  The overall quality of the study is fair.  Left ventricular cavity is noted to be normal on the rest and stress studies.  Gated SPECT images reveal normal myocarial thickening and wall motion.  The left ventricular ejection fraction was calculated 35%, although visually appears normal.  Small sized, severe intensity perfusion defect in anteroseptal myocardium seen only on rest images likely represents tissue attenuation artifact.   3. High risk stress test due to reduced LVEF.  Clinical correlation recommended.  Past Medical History:  Diagnosis Date  . Arthritis   . Environmental allergies   . External hemorrhoid   . Heart murmur    asymptomatic  .  Hypertension     Past Surgical History:  Procedure Laterality Date  . APPENDECTOMY  08-20-2009  . DX LAPAROSCOPY /  LYSIS ADHESIONS/  OPEN ILEOCECAL RESECTION  08-28-2009   cecal perforation  . EVALUATION UNDER ANESTHESIA WITH HEMORRHOIDECTOMY N/A 03/12/2015   Procedure: EXAM UNDER ANESTHESIA WITH HEMORRHOIDECTOMY;  Surgeon: Axel Filler, MD;  Location: [facility];  Service: General;  Laterality: N/A;  . KNEE ARTHROSCOPY W/  MENISCECTOMY Right 07-10-2006    MEDICATIONS: . sildenafil (VIAGRA) 100 MG tablet  . acetaminophen (TYLENOL) 500 MG tablet  . albuterol (VENTOLIN HFA) 108 (90 Base) MCG/ACT inhaler  . amLODipine (NORVASC) 10 MG tablet  . Cyanocobalamin (VITAMIN B-12 PO)  . Liniments (DEEP BLUE RELIEF EX)  . meloxicam (MOBIC) 15 MG tablet  . triamterene-hydrochlorothiazide (MAXZIDE-25) 37.5-25 MG tablet   No current facility-administered medications for this encounter.    Janey Genta [facility] Pre-Surgical Testing [phone] 07/31/18 4:10 PM

## 2018-07-31 NOTE — Progress Notes (Signed)
06/26/2018- Office note from Dr. Sherril Croon on chart  06/20/2018- ECHO test results from Dr. Sherril Croon on chart  06/08/2018- Stress test results on chart from Dr. Sherril Croon  06/01/2018- EKG from Dr. Sherril Croon on chart

## 2018-07-31 NOTE — Anesthesia Preprocedure Evaluation (Addendum)
Anesthesia Evaluation  Patient identified by MRN, date of birth, ID band Patient awake    Reviewed: Allergy & Precautions, NPO status , Patient's Chart, lab work & pertinent test results  History of Anesthesia Complications Negative for: history of anesthetic complications  Airway Mallampati: II  TM Distance: >3 FB Neck ROM: Full    Dental  (+) Teeth Intact, Dental Advisory Given   Pulmonary neg pulmonary ROS,    Pulmonary exam normal breath sounds clear to auscultation       Cardiovascular hypertension, Pt. on medications Normal cardiovascular exam Rhythm:Regular Rate:Normal     Neuro/Psych negative neurological ROS     GI/Hepatic negative GI ROS, Neg liver ROS,   Endo/Other  negative endocrine ROS  Renal/GU negative Renal ROS     Musculoskeletal  (+) Arthritis ,   Abdominal   Peds  Hematology negative hematology ROS (+)   Anesthesia Other Findings Day of surgery medications reviewed with the patient.  Reproductive/Obstetrics                           Anesthesia Physical Anesthesia Plan  ASA: II  Anesthesia Plan: Spinal   Post-op Pain Management:  Regional for Post-op pain   Induction:   PONV Risk Score and Plan: 1 and Treatment may vary due to age or medical condition, Ondansetron, Propofol infusion and Dexamethasone  Airway Management Planned: Natural Airway and Simple Face Mask  Additional Equipment:   Intra-op Plan:   Post-operative Plan:   Informed Consent: I have reviewed the patients History and Physical, chart, labs and discussed the procedure including the risks, benefits and alternatives for the proposed anesthesia with the patient or authorized representative who has indicated his/her understanding and acceptance.     Dental advisory given  Plan Discussed with: CRNA  Anesthesia Plan Comments: (See PST note 07/31/18, Jodell Cipro, PA-C)      Anesthesia  Quick Evaluation

## 2018-08-05 MED ORDER — BUPIVACAINE LIPOSOME 1.3 % IJ SUSP
20.0000 mL | INTRAMUSCULAR | Status: DC
  Filled 2018-08-05: qty 20

## 2018-08-06 ENCOUNTER — Inpatient Hospital Stay (HOSPITAL_COMMUNITY)
Admission: RE | Admit: 2018-08-06 | Discharge: 2018-08-07 | DRG: 470 | Disposition: A | Discharge status: Home / Self Care | Payer: BLUE CROSS/BLUE SHIELD | Attending: Orthopedic Surgery | Admitting: Orthopedic Surgery

## 2018-08-06 ENCOUNTER — Encounter (HOSPITAL_COMMUNITY): Payer: Self-pay | Admitting: *Deleted

## 2018-08-06 ENCOUNTER — Inpatient Hospital Stay (HOSPITAL_COMMUNITY): Payer: BLUE CROSS/BLUE SHIELD | Admitting: Anesthesiology

## 2018-08-06 ENCOUNTER — Encounter (HOSPITAL_COMMUNITY)
Admission: RE | Disposition: A | Discharge status: Home / Self Care | Payer: Self-pay | Source: Home / Self Care | Attending: Orthopedic Surgery

## 2018-08-06 ENCOUNTER — Inpatient Hospital Stay (HOSPITAL_COMMUNITY): Payer: BLUE CROSS/BLUE SHIELD | Admitting: Physician Assistant

## 2018-08-06 ENCOUNTER — Other Ambulatory Visit: Payer: Self-pay

## 2018-08-06 DIAGNOSIS — M1711 Unilateral primary osteoarthritis, right knee: Secondary | ICD-10-CM | POA: Diagnosis present

## 2018-08-06 DIAGNOSIS — Z6830 Body mass index (BMI) 30.0-30.9, adult: Secondary | ICD-10-CM

## 2018-08-06 DIAGNOSIS — Z8249 Family history of ischemic heart disease and other diseases of the circulatory system: Secondary | ICD-10-CM

## 2018-08-06 DIAGNOSIS — R011 Cardiac murmur, unspecified: Secondary | ICD-10-CM | POA: Diagnosis present

## 2018-08-06 DIAGNOSIS — M25561 Pain in right knee: Secondary | ICD-10-CM | POA: Diagnosis present

## 2018-08-06 DIAGNOSIS — E669 Obesity, unspecified: Secondary | ICD-10-CM | POA: Diagnosis present

## 2018-08-06 DIAGNOSIS — Z91040 Latex allergy status: Secondary | ICD-10-CM | POA: Diagnosis not present

## 2018-08-06 DIAGNOSIS — Z79899 Other long term (current) drug therapy: Secondary | ICD-10-CM

## 2018-08-06 DIAGNOSIS — M25761 Osteophyte, right knee: Secondary | ICD-10-CM | POA: Diagnosis present

## 2018-08-06 DIAGNOSIS — I10 Essential (primary) hypertension: Secondary | ICD-10-CM | POA: Diagnosis present

## 2018-08-06 DIAGNOSIS — M1710 Unilateral primary osteoarthritis, unspecified knee: Secondary | ICD-10-CM

## 2018-08-06 DIAGNOSIS — T8482XA Fibrosis due to internal orthopedic prosthetic devices, implants and grafts, initial encounter: Secondary | ICD-10-CM | POA: Diagnosis present

## 2018-08-06 DIAGNOSIS — M179 Osteoarthritis of knee, unspecified: Secondary | ICD-10-CM

## 2018-08-06 HISTORY — PX: TOTAL KNEE ARTHROPLASTY: SHX125

## 2018-08-06 LAB — TYPE AND SCREEN
ABO/RH(D): A NEG
Antibody Screen: NEGATIVE

## 2018-08-06 SURGERY — ARTHROPLASTY, KNEE, TOTAL
Anesthesia: Spinal | Laterality: Right

## 2018-08-06 MED ORDER — HYDROMORPHONE HCL 1 MG/ML IJ SOLN
0.2500 mg | INTRAMUSCULAR | Status: DC | PRN
  Administered 2018-08-06 (×4): 0.5 mg via INTRAVENOUS

## 2018-08-06 MED ORDER — METHOCARBAMOL 500 MG IVPB - SIMPLE MED
500.0000 mg | Freq: Four times a day (QID) | INTRAVENOUS | Status: DC | PRN
  Administered 2018-08-06: 500 mg via INTRAVENOUS
  Filled 2018-08-06: qty 50

## 2018-08-06 MED ORDER — CEFAZOLIN SODIUM-DEXTROSE 2-4 GM/100ML-% IV SOLN
2.0000 g | Freq: Four times a day (QID) | INTRAVENOUS | Status: AC
  Administered 2018-08-06 – 2018-08-07 (×2): 2 g via INTRAVENOUS
  Filled 2018-08-06 (×2): qty 100

## 2018-08-06 MED ORDER — DEXAMETHASONE SODIUM PHOSPHATE 10 MG/ML IJ SOLN
INTRAMUSCULAR | Status: DC | PRN
  Administered 2018-08-06: 10 mg via INTRAVENOUS

## 2018-08-06 MED ORDER — CHLORHEXIDINE GLUCONATE 4 % EX LIQD
60.0000 mL | Freq: Once | CUTANEOUS | Status: AC
  Administered 2018-08-06: 4 via TOPICAL

## 2018-08-06 MED ORDER — LIDOCAINE HCL (CARDIAC) PF 100 MG/5ML IV SOSY
PREFILLED_SYRINGE | INTRAVENOUS | Status: DC | PRN
  Administered 2018-08-06: 60 mg via INTRAVENOUS

## 2018-08-06 MED ORDER — BUPIVACAINE IN DEXTROSE 0.75-8.25 % IT SOLN
INTRATHECAL | Status: DC | PRN
  Administered 2018-08-06: 1.6 mL via INTRATHECAL

## 2018-08-06 MED ORDER — DEXAMETHASONE SODIUM PHOSPHATE 10 MG/ML IJ SOLN: 8.0000 mg | Freq: Once | INTRAMUSCULAR | Status: DC

## 2018-08-06 MED ORDER — DEXAMETHASONE SODIUM PHOSPHATE 10 MG/ML IJ SOLN: 10.0000 mg | Freq: Once | INTRAMUSCULAR | Status: DC

## 2018-08-06 MED ORDER — PROPOFOL 10 MG/ML IV BOLUS
INTRAVENOUS | Status: DC | PRN
  Administered 2018-08-06: 20 mg via INTRAVENOUS
  Administered 2018-08-06: 10 mg via INTRAVENOUS
  Administered 2018-08-06: 20 mg via INTRAVENOUS

## 2018-08-06 MED ORDER — FENTANYL CITRATE (PF) 100 MCG/2ML IJ SOLN
50.0000 ug | INTRAMUSCULAR | Status: DC
  Administered 2018-08-06: 50 ug via INTRAVENOUS
  Filled 2018-08-06: qty 2

## 2018-08-06 MED ORDER — METHOCARBAMOL 500 MG PO TABS
500.0000 mg | ORAL_TABLET | Freq: Four times a day (QID) | ORAL | Status: DC | PRN
  Administered 2018-08-06 – 2018-08-07 (×2): 500 mg via ORAL
  Filled 2018-08-06 (×2): qty 1

## 2018-08-06 MED ORDER — MIDAZOLAM HCL 2 MG/2ML IJ SOLN
1.0000 mg | INTRAMUSCULAR | Status: DC
  Administered 2018-08-06: 1 mg via INTRAVENOUS
  Filled 2018-08-06: qty 2

## 2018-08-06 MED ORDER — SODIUM CHLORIDE 0.9 % IR SOLN
Status: DC | PRN
  Administered 2018-08-06: 1000 mL

## 2018-08-06 MED ORDER — STERILE WATER FOR IRRIGATION IR SOLN
Status: DC | PRN
  Administered 2018-08-06: 2000 mL

## 2018-08-06 MED ORDER — METOCLOPRAMIDE HCL 5 MG/ML IJ SOLN: 5.0000 mg | Freq: Three times a day (TID) | INTRAMUSCULAR | Status: DC | PRN

## 2018-08-06 MED ORDER — PHENYLEPHRINE 40 MCG/ML (10ML) SYRINGE FOR IV PUSH (FOR BLOOD PRESSURE SUPPORT)
PREFILLED_SYRINGE | INTRAVENOUS | Status: AC
  Filled 2018-08-06: qty 10

## 2018-08-06 MED ORDER — ONDANSETRON HCL 4 MG/2ML IJ SOLN
INTRAMUSCULAR | Status: AC
  Filled 2018-08-06: qty 2

## 2018-08-06 MED ORDER — DIPHENHYDRAMINE HCL 12.5 MG/5ML PO ELIX: 12.5000 mg | ORAL_SOLUTION | ORAL | Status: DC | PRN

## 2018-08-06 MED ORDER — HYDROMORPHONE HCL 1 MG/ML IJ SOLN
INTRAMUSCULAR | Status: AC
  Filled 2018-08-06: qty 1

## 2018-08-06 MED ORDER — PHENYLEPHRINE 40 MCG/ML (10ML) SYRINGE FOR IV PUSH (FOR BLOOD PRESSURE SUPPORT)
PREFILLED_SYRINGE | INTRAVENOUS | Status: DC | PRN
  Administered 2018-08-06 (×7): 80 ug via INTRAVENOUS

## 2018-08-06 MED ORDER — LORAZEPAM 2 MG/ML IJ SOLN: 0.5000 mg | Freq: Four times a day (QID) | INTRAMUSCULAR | Status: DC | PRN

## 2018-08-06 MED ORDER — DOCUSATE SODIUM 100 MG PO CAPS
100.0000 mg | ORAL_CAPSULE | Freq: Two times a day (BID) | ORAL | Status: DC
  Administered 2018-08-06 – 2018-08-07 (×2): 100 mg via ORAL
  Filled 2018-08-06 (×2): qty 1

## 2018-08-06 MED ORDER — FENTANYL CITRATE (PF) 100 MCG/2ML IJ SOLN
25.0000 ug | INTRAMUSCULAR | Status: DC | PRN
  Administered 2018-08-06 (×2): 25 ug via INTRAVENOUS
  Administered 2018-08-06: 50 ug via INTRAVENOUS

## 2018-08-06 MED ORDER — OXYCODONE HCL 5 MG/5ML PO SOLN: 5.0000 mg | Freq: Once | ORAL | Status: DC | PRN

## 2018-08-06 MED ORDER — OXYCODONE HCL 5 MG PO TABS: 5.0000 mg | ORAL_TABLET | Freq: Once | ORAL | Status: DC | PRN

## 2018-08-06 MED ORDER — TRANEXAMIC ACID-NACL 1000-0.7 MG/100ML-% IV SOLN
1000.0000 mg | Freq: Once | INTRAVENOUS | Status: AC
  Administered 2018-08-06: 1000 mg via INTRAVENOUS
  Filled 2018-08-06: qty 100

## 2018-08-06 MED ORDER — ACETAMINOPHEN 10 MG/ML IV SOLN
1000.0000 mg | Freq: Four times a day (QID) | INTRAVENOUS | Status: DC
  Administered 2018-08-06: 1000 mg via INTRAVENOUS
  Filled 2018-08-06: qty 100

## 2018-08-06 MED ORDER — DEXAMETHASONE SODIUM PHOSPHATE 10 MG/ML IJ SOLN
INTRAMUSCULAR | Status: AC
  Filled 2018-08-06: qty 1

## 2018-08-06 MED ORDER — PROPOFOL 10 MG/ML IV BOLUS
INTRAVENOUS | Status: AC
  Filled 2018-08-06: qty 80

## 2018-08-06 MED ORDER — BUPIVACAINE-EPINEPHRINE (PF) 0.5% -1:200000 IJ SOLN
INTRAMUSCULAR | Status: DC | PRN
  Administered 2018-08-06: 15 mL via PERINEURAL

## 2018-08-06 MED ORDER — ONDANSETRON HCL 4 MG PO TABS: 4.0000 mg | ORAL_TABLET | Freq: Four times a day (QID) | ORAL | Status: DC | PRN

## 2018-08-06 MED ORDER — POLYETHYLENE GLYCOL 3350 17 G PO PACK: 17.0000 g | PACK | Freq: Every day | ORAL | Status: DC | PRN

## 2018-08-06 MED ORDER — PROPOFOL 500 MG/50ML IV EMUL
INTRAVENOUS | Status: DC | PRN
  Administered 2018-08-06: 125 ug/kg/min via INTRAVENOUS

## 2018-08-06 MED ORDER — BUPIVACAINE LIPOSOME 1.3 % IJ SUSP
INTRAMUSCULAR | Status: DC | PRN
  Administered 2018-08-06: 20 mL

## 2018-08-06 MED ORDER — SODIUM CHLORIDE 0.9 % IV SOLN
INTRAVENOUS | Status: DC | PRN
  Administered 2018-08-06: 50 ug/min via INTRAVENOUS

## 2018-08-06 MED ORDER — GABAPENTIN 300 MG PO CAPS
300.0000 mg | ORAL_CAPSULE | Freq: Three times a day (TID) | ORAL | Status: DC
  Administered 2018-08-06 – 2018-08-07 (×3): 300 mg via ORAL
  Filled 2018-08-06 (×3): qty 1

## 2018-08-06 MED ORDER — FLEET ENEMA 7-19 GM/118ML RE ENEM: 1.0000 | ENEMA | Freq: Once | RECTAL | Status: DC | PRN

## 2018-08-06 MED ORDER — SODIUM CHLORIDE (PF) 0.9 % IJ SOLN
INTRAMUSCULAR | Status: DC | PRN
  Administered 2018-08-06: 60 mL

## 2018-08-06 MED ORDER — BISACODYL 10 MG RE SUPP: 10.0000 mg | Freq: Every day | RECTAL | Status: DC | PRN

## 2018-08-06 MED ORDER — METOCLOPRAMIDE HCL 5 MG PO TABS: 5.0000 mg | ORAL_TABLET | Freq: Three times a day (TID) | ORAL | Status: DC | PRN

## 2018-08-06 MED ORDER — ONDANSETRON HCL 4 MG/2ML IJ SOLN: 4.0000 mg | Freq: Four times a day (QID) | INTRAMUSCULAR | Status: DC | PRN

## 2018-08-06 MED ORDER — OXYCODONE HCL 5 MG PO TABS
5.0000 mg | ORAL_TABLET | ORAL | Status: DC | PRN
  Administered 2018-08-06 – 2018-08-07 (×3): 5 mg via ORAL
  Administered 2018-08-07: 10 mg via ORAL
  Filled 2018-08-06: qty 1
  Filled 2018-08-06: qty 2
  Filled 2018-08-06: qty 1
  Filled 2018-08-06: qty 2

## 2018-08-06 MED ORDER — LACTATED RINGERS IV SOLN
INTRAVENOUS | Status: DC
  Administered 2018-08-06: 10:00:00 via INTRAVENOUS

## 2018-08-06 MED ORDER — AMLODIPINE BESYLATE 10 MG PO TABS
10.0000 mg | ORAL_TABLET | Freq: Every day | ORAL | Status: DC
  Administered 2018-08-07: 10 mg via ORAL
  Filled 2018-08-06: qty 1

## 2018-08-06 MED ORDER — SODIUM CHLORIDE 0.9 % IV SOLN
INTRAVENOUS | Status: DC
  Administered 2018-08-06: 17:00:00 via INTRAVENOUS

## 2018-08-06 MED ORDER — FENTANYL CITRATE (PF) 100 MCG/2ML IJ SOLN
INTRAMUSCULAR | Status: AC
  Filled 2018-08-06: qty 2

## 2018-08-06 MED ORDER — 0.9 % SODIUM CHLORIDE (POUR BTL) OPTIME
TOPICAL | Status: DC | PRN
  Administered 2018-08-06: 1000 mL

## 2018-08-06 MED ORDER — TRIAMTERENE-HCTZ 37.5-25 MG PO TABS
1.0000 | ORAL_TABLET | Freq: Every day | ORAL | Status: DC
  Administered 2018-08-07: 1 via ORAL
  Filled 2018-08-06: qty 1

## 2018-08-06 MED ORDER — TRAMADOL HCL 50 MG PO TABS: 50.0000 mg | ORAL_TABLET | Freq: Four times a day (QID) | ORAL | Status: DC | PRN

## 2018-08-06 MED ORDER — LACTATED RINGERS IV SOLN
INTRAVENOUS | Status: DC | PRN
  Administered 2018-08-06 (×2): via INTRAVENOUS

## 2018-08-06 MED ORDER — SODIUM CHLORIDE (PF) 0.9 % IJ SOLN
INTRAMUSCULAR | Status: AC
  Filled 2018-08-06: qty 50

## 2018-08-06 MED ORDER — TRANEXAMIC ACID-NACL 1000-0.7 MG/100ML-% IV SOLN
1000.0000 mg | INTRAVENOUS | Status: AC
  Administered 2018-08-06: 1000 mg via INTRAVENOUS
  Filled 2018-08-06: qty 100

## 2018-08-06 MED ORDER — ALBUTEROL SULFATE (2.5 MG/3ML) 0.083% IN NEBU: 3.0000 mL | INHALATION_SOLUTION | Freq: Four times a day (QID) | RESPIRATORY_TRACT | Status: DC | PRN

## 2018-08-06 MED ORDER — METHOCARBAMOL 500 MG IVPB - SIMPLE MED
INTRAVENOUS | Status: AC
  Filled 2018-08-06: qty 50

## 2018-08-06 MED ORDER — ONDANSETRON HCL 4 MG/2ML IJ SOLN
INTRAMUSCULAR | Status: DC | PRN
  Administered 2018-08-06: 4 mg via INTRAVENOUS

## 2018-08-06 MED ORDER — LIDOCAINE 2% (20 MG/ML) 5 ML SYRINGE
INTRAMUSCULAR | Status: AC
  Filled 2018-08-06: qty 5

## 2018-08-06 MED ORDER — ASPIRIN EC 325 MG PO TBEC
325.0000 mg | DELAYED_RELEASE_TABLET | Freq: Two times a day (BID) | ORAL | Status: DC
  Administered 2018-08-07: 325 mg via ORAL
  Filled 2018-08-06: qty 1

## 2018-08-06 MED ORDER — SODIUM CHLORIDE (PF) 0.9 % IJ SOLN
INTRAMUSCULAR | Status: AC
  Filled 2018-08-06: qty 10

## 2018-08-06 MED ORDER — MENTHOL 3 MG MT LOZG: 1.0000 | LOZENGE | OROMUCOSAL | Status: DC | PRN

## 2018-08-06 MED ORDER — CEFAZOLIN SODIUM-DEXTROSE 2-4 GM/100ML-% IV SOLN
2.0000 g | INTRAVENOUS | Status: AC
  Administered 2018-08-06: 2 g via INTRAVENOUS
  Filled 2018-08-06: qty 100

## 2018-08-06 MED ORDER — PHENOL 1.4 % MT LIQD
1.0000 | OROMUCOSAL | Status: DC | PRN
  Filled 2018-08-06: qty 177

## 2018-08-06 MED ORDER — PROMETHAZINE HCL 25 MG/ML IJ SOLN: 6.2500 mg | INTRAMUSCULAR | Status: DC | PRN

## 2018-08-06 MED ORDER — ACETAMINOPHEN 10 MG/ML IV SOLN: 1000.0000 mg | Freq: Once | INTRAVENOUS | Status: DC | PRN

## 2018-08-06 MED ORDER — ACETAMINOPHEN 500 MG PO TABS
1000.0000 mg | ORAL_TABLET | Freq: Four times a day (QID) | ORAL | Status: AC
  Administered 2018-08-06 – 2018-08-07 (×4): 1000 mg via ORAL
  Filled 2018-08-06 (×4): qty 2

## 2018-08-06 MED ORDER — MORPHINE SULFATE (PF) 4 MG/ML IV SOLN: 1.0000 mg | INTRAVENOUS | Status: DC | PRN

## 2018-08-06 SURGICAL SUPPLY — 65 items
ATTUNE MED DOME PAT 38 KNEE (Knees) ×1 IMPLANT
ATTUNE MED DOME PAT 38MM KNEE (Knees) ×1 IMPLANT
ATTUNE PS FEM RT SZ 7 CEM KNEE (Femur) ×2 IMPLANT
ATTUNE PSRP INSR SZ7 8 KNEE (Insert) ×1 IMPLANT
ATTUNE PSRP INSR SZ7 8MM KNEE (Insert) ×1 IMPLANT
BAG SPEC THK2 15X12 ZIP CLS (MISCELLANEOUS) ×1
BAG ZIPLOCK 12X15 (MISCELLANEOUS) ×3 IMPLANT
BANDAGE ACE 6X5 VEL STRL LF (GAUZE/BANDAGES/DRESSINGS) ×3 IMPLANT
BANDAGE ELASTIC 6 VELCRO ST LF (GAUZE/BANDAGES/DRESSINGS) ×2 IMPLANT
BASE TIBIAL ROT PLAT SZ 7 KNEE (Knees) IMPLANT
BLADE SAG 18X100X1.27 (BLADE) ×3 IMPLANT
BLADE SAW SGTL 11.0X1.19X90.0M (BLADE) ×3 IMPLANT
BLADE SURG SZ10 CARB STEEL (BLADE) ×6 IMPLANT
BNDG CMPR 82X61 PLY HI ABS (GAUZE/BANDAGES/DRESSINGS) ×1
BNDG CONFORM 6X.82 1P STRL (GAUZE/BANDAGES/DRESSINGS) ×2 IMPLANT
BOWL SMART MIX CTS (DISPOSABLE) ×3 IMPLANT
BSPLAT TIB 7 CMNT ROT PLAT STR (Knees) ×1 IMPLANT
CEMENT HV SMART SET (Cement) ×6 IMPLANT
CLOSURE WOUND 1/2 X4 (GAUZE/BANDAGES/DRESSINGS) ×2
COVER SURGICAL LIGHT HANDLE (MISCELLANEOUS) ×3 IMPLANT
COVER WAND RF STERILE (DRAPES) IMPLANT
CUFF TOURN SGL QUICK 34 (TOURNIQUET CUFF) ×3
CUFF TRNQT CYL 34X4.125X (TOURNIQUET CUFF) ×1 IMPLANT
DECANTER SPIKE VIAL GLASS SM (MISCELLANEOUS) ×3 IMPLANT
DRAPE U-SHAPE 47X51 STRL (DRAPES) ×3 IMPLANT
DRSG ADAPTIC 3X8 NADH LF (GAUZE/BANDAGES/DRESSINGS) ×3 IMPLANT
DRSG PAD ABDOMINAL 8X10 ST (GAUZE/BANDAGES/DRESSINGS) ×3 IMPLANT
DURAPREP 26ML APPLICATOR (WOUND CARE) ×3 IMPLANT
ELECT REM PT RETURN 15FT ADLT (MISCELLANEOUS) ×3 IMPLANT
EVACUATOR 1/8 PVC DRAIN (DRAIN) ×3 IMPLANT
GAUZE SPONGE 4X4 12PLY STRL (GAUZE/BANDAGES/DRESSINGS) ×3 IMPLANT
GLOVE BIO SURGEON STRL SZ7 (GLOVE) ×3 IMPLANT
GLOVE BIO SURGEON STRL SZ8 (GLOVE) ×3 IMPLANT
GLOVE BIOGEL PI IND STRL 6.5 (GLOVE) ×1 IMPLANT
GLOVE BIOGEL PI IND STRL 7.0 (GLOVE) ×1 IMPLANT
GLOVE BIOGEL PI IND STRL 8 (GLOVE) ×1 IMPLANT
GLOVE BIOGEL PI INDICATOR 6.5 (GLOVE) ×2
GLOVE BIOGEL PI INDICATOR 7.0 (GLOVE) ×2
GLOVE BIOGEL PI INDICATOR 8 (GLOVE) ×2
GLOVE SURG SS PI 6.5 STRL IVOR (GLOVE) ×3 IMPLANT
GOWN STRL REUS W/TWL LRG LVL3 (GOWN DISPOSABLE) ×9 IMPLANT
HANDPIECE INTERPULSE COAX TIP (DISPOSABLE) ×3
HOLDER FOLEY CATH W/STRAP (MISCELLANEOUS) IMPLANT
IMMOBILIZER KNEE 20 (SOFTGOODS) ×3
IMMOBILIZER KNEE 20 THIGH 36 (SOFTGOODS) ×1 IMPLANT
MANIFOLD NEPTUNE II (INSTRUMENTS) ×3 IMPLANT
NS IRRIG 1000ML POUR BTL (IV SOLUTION) ×3 IMPLANT
PACK TOTAL KNEE CUSTOM (KITS) ×3 IMPLANT
PADDING CAST COTTON 6X4 STRL (CAST SUPPLIES) ×9 IMPLANT
PIN STEINMAN FIXATION KNEE (PIN) ×2 IMPLANT
PIN THREADED HEADED SIGMA (PIN) ×2 IMPLANT
PROTECTOR NERVE ULNAR (MISCELLANEOUS) ×3 IMPLANT
SET HNDPC FAN SPRY TIP SCT (DISPOSABLE) ×1 IMPLANT
STRIP CLOSURE SKIN 1/2X4 (GAUZE/BANDAGES/DRESSINGS) ×4 IMPLANT
SUT MNCRL AB 4-0 PS2 18 (SUTURE) ×3 IMPLANT
SUT STRATAFIX 0 PDS 27 VIOLET (SUTURE) ×3
SUT VIC AB 2-0 CT1 27 (SUTURE) ×9
SUT VIC AB 2-0 CT1 TAPERPNT 27 (SUTURE) ×3 IMPLANT
SUTURE STRATFX 0 PDS 27 VIOLET (SUTURE) ×1 IMPLANT
TAPE STRIPS DRAPE STRL (GAUZE/BANDAGES/DRESSINGS) ×2 IMPLANT
TIBIAL BASE ROT PLAT SZ 7 KNEE (Knees) ×3 IMPLANT
TRAY FOLEY MTR SLVR 16FR STAT (SET/KITS/TRAYS/PACK) ×3 IMPLANT
WATER STERILE IRR 1000ML POUR (IV SOLUTION) ×6 IMPLANT
WRAP KNEE MAXI GEL POST OP (GAUZE/BANDAGES/DRESSINGS) ×3 IMPLANT
YANKAUER SUCT BULB TIP 10FT TU (MISCELLANEOUS) ×3 IMPLANT

## 2018-08-06 NOTE — Evaluation (Signed)
Physical Therapy Evaluation Patient Details Name: [name] [name] MRN: [id number] DOB: November 17, 1960 Today's Date: 08/06/2018   History of Present Illness  58 yo male s/p R TKR on 08/06/18. PMH includes resection of terminal ileum, ETOH abuse, heart murmur.   Clinical Impression  Pt presents with R knee pain, decreased R knee ROM, increased time and effort to perform mobility tasks, and decreased tolerance for ambulation due to R knee pain. Pt to benefit from acute PT to address deficits. Pt ambulated hallway distance with RW with min guard assist, verbal cuing provided for form and safety provided throughout. Pt slightly impulsive and wants to perform mobility tasks without assist, pt and family strongly encouraged to press call button when pt needs to mobilize and to wait for assistance. PT to progress mobility as tolerated, and will continue to follow acutely.        Follow Up Recommendations Follow surgeon's recommendation for DC plan and follow-up therapies;Supervision for mobility/OOB    Equipment Recommendations  None recommended by PT    Recommendations for Other Services       Precautions / Restrictions Precautions Precautions: Fall Required Braces or Orthoses: Knee Immobilizer - Right Knee Immobilizer - Right: On when out of bed or walking;Discontinue once straight leg raise with < 10 degree lag Restrictions Weight Bearing Restrictions: No Other Position/Activity Restrictions: WBAT       Mobility  Bed Mobility Overal bed mobility: Needs Assistance Bed Mobility: Supine to Sit;Sit to Supine     Supine to sit: Min guard;HOB elevated Sit to supine: Min guard;HOB elevated   General bed mobility comments: Pt defers going to recliner after ambulation. Min guard for safety for supine<>sit, increased time/effort and use of bedrails.   Transfers Overall transfer level: Needs assistance Equipment used: Rolling walker (2 wheeled) Transfers: Sit to/from Stand Sit to Stand:  Min guard;From elevated surface         General transfer comment: Min guard for safety. Sit to stand x2, once from bed and once from toilet as pt thought he had to have BM during session. Verbal cuing for hand placement when rising, RLE placement out in front of himself with sitting and standing due to KI.   Ambulation/Gait Ambulation/Gait assistance: Min guard Gait Distance (Feet): 75 Feet Assistive device: Rolling walker (2 wheeled) Gait Pattern/deviations: Step-to pattern;Decreased stance time - right;Decreased weight shift to right;Antalgic;Trunk flexed Gait velocity: decr    General Gait Details: Min guard for safety. Verbal cuing for placement in RW, sequencing, upright posture as pt with tendency to round shoulder and stand in hip flexion.   Stairs            Wheelchair Mobility    Modified Rankin (Stroke Patients Only)       Balance Overall balance assessment: Mild deficits observed, not formally tested                                           Pertinent Vitals/Pain Pain Assessment: 0-10 Pain Score: 5  Pain Descriptors / Indicators: Sore Pain Intervention(s): Limited activity within patient's tolerance;Repositioned;Ice applied;Monitored during session;Premedicated before session    Home Living Family/patient expects to be discharged to:: Private residence Living Arrangements: Spouse/significant other Available Help at Discharge: Family;Available PRN/intermittently Type of Home: House Home Access: Stairs to enter Entrance Stairs-Rails: Left Entrance Stairs-Number of Steps: 5 Home Layout: One level Home Equipment: Walker - 2 wheels;Cane -  single point      Prior Function Level of Independence: Independent with assistive device(s)         Comments: Pt reports using cane intermittently for pain. Pt reports bilateral hips have been painful recently due to R knee dysfunction.     Hand Dominance   Dominant Hand: Right     Extremity/Trunk Assessment   Upper Extremity Assessment Upper Extremity Assessment: Overall WFL for tasks assessed    Lower Extremity Assessment Lower Extremity Assessment: Overall WFL for tasks assessed;RLE deficits/detail RLE Deficits / Details: suspected post-surgical weakness; able to perform ankle pumps, quad set, heel slides, SLR with lift assist RLE Sensation: WNL    Cervical / Trunk Assessment Cervical / Trunk Assessment: Normal  Communication   Communication: No difficulties  Cognition Arousal/Alertness: Awake/alert Behavior During Therapy: WFL for tasks assessed/performed Overall Cognitive Status: Within Functional Limits for tasks assessed                                 General Comments: Pt with increased processing time to answer questions and is slightly drowsy during session, per pt it is the pain medications.       General Comments      Exercises Total Joint Exercises Goniometric ROM: knee aarom ~10-45*, limited by pain and stiffness   Assessment/Plan    PT Assessment Patient needs continued PT services  PT Problem List Decreased strength;Pain;Decreased range of motion;Decreased activity tolerance;Decreased knowledge of use of DME;Decreased balance;Decreased mobility;Decreased safety awareness       PT Treatment Interventions DME instruction;Therapeutic activities;Gait training;Therapeutic exercise;Patient/family education;Stair training;Balance training;Functional mobility training    PT Goals (Current goals can be found in the Care Plan section)  Acute Rehab PT Goals Patient Stated Goal: go home  PT Goal Formulation: With patient Time For Goal Achievement: 08/13/18 Potential to Achieve Goals: Good    Frequency 7X/week   Barriers to discharge        Co-evaluation               AM-PAC PT "6 Clicks" Mobility  Outcome Measure Help needed turning from your back to your side while in a flat bed without using bedrails?: A  Little Help needed moving from lying on your back to sitting on the side of a flat bed without using bedrails?: A Little Help needed moving to and from a bed to a chair (including a wheelchair)?: A Little Help needed standing up from a chair using your arms (e.g., wheelchair or bedside chair)?: A Little Help needed to walk in hospital room?: A Little Help needed climbing 3-5 steps with a railing? : A Little 6 Click Score: 18    End of Session Equipment Utilized During Treatment: Gait belt;Right knee immobilizer Activity Tolerance: Patient tolerated treatment well Patient left: in bed;with bed alarm set;with call bell/phone within reach;with family/visitor present(break from SCDs for skin integrity) Nurse Communication: Mobility status PT Visit Diagnosis: Other abnormalities of gait and mobility (R26.89);Difficulty in walking, not elsewhere classified (R26.2)    Time: 1750-1820 PT Time Calculation (min) (ACUTE ONLY): 30 min   Charges:   PT Evaluation $PT Eval Low Complexity: 1 Low PT Treatments $Gait Training: 8-22 mins        Nicola Police, PT Acute Rehabilitation Services Pager [phone]  Office [phone]   Tyrone Apple D Despina Hidden 08/06/2018, 7:46 PM

## 2018-08-06 NOTE — Transfer of Care (Signed)
Immediate Anesthesia Transfer of Care Note  Patient: [name] [name]  Procedure(s) Performed: TOTAL KNEE ARTHROPLASTY (Right )  Patient Location: PACU  Anesthesia Type:Spinal  Level of Consciousness: awake, alert , oriented and drowsy  Airway & Oxygen Therapy: Patient Spontanous Breathing and Patient connected to face mask oxygen  Post-op Assessment: Report given to RN, Post -op Vital signs reviewed and stable and Patient moving all extremities  Post vital signs: Reviewed and stable  Last Vitals:  Vitals Value Taken Time  BP 113/88 08/06/2018 12:58 PM  Temp    Pulse 75 08/06/2018  1:00 PM  Resp 17 08/06/2018  1:00 PM  SpO2 100 % 08/06/2018  1:00 PM  Vitals shown include unvalidated device data.  Last Pain:  Vitals:   08/06/18 1113  TempSrc:   PainSc: 0-No pain         Complications: No apparent anesthesia complications

## 2018-08-06 NOTE — Anesthesia Procedure Notes (Signed)
Anesthesia Regional Block: Adductor canal block   Pre-Anesthetic Checklist: ,, timeout performed, Correct Patient, Correct Site, Correct Laterality, Correct Procedure, Correct Position, site marked, Risks and benefits discussed, pre-op evaluation,  At surgeon's request and post-op pain management  Laterality: Right  Prep: Maximum Sterile Barrier Precautions used, chloraprep       Needles:  Injection technique: Single-shot  Needle Type: Echogenic Stimulator Needle     Needle Length: 9cm  Needle Gauge: 22     Additional Needles:   Procedures:,,,, ultrasound used (permanent image in chart),,,,  Narrative:  Start time: 08/06/2018 10:58 AM End time: 08/06/2018 11:01 AM Injection made incrementally with aspirations every 5 mL.  Performed by: Personally  Anesthesiologist: Kaylyn Layer, MD  Additional Notes: Risks, benefits, and alternative discussed. Patient gave consent for procedure. Patient prepped and draped in sterile fashion. Sedation administered, patient remains easily responsive to voice. Relevant anatomy identified with ultrasound guidance. Local anesthetic given in 5cc increments with no signs or symptoms of intravascular injection. No pain or paraesthesias with injection. Patient monitored throughout procedure with signs of LAST or immediate complications. Tolerated well. Ultrasound image placed in chart.  Amalia Greenhouse, MD

## 2018-08-06 NOTE — Anesthesia Procedure Notes (Signed)
Spinal  Patient location during procedure: OR End time: 08/06/2018 11:46 AM Staffing Resident/CRNA: Caryl Pina T, CRNA Performed: resident/CRNA  Preanesthetic Checklist Completed: patient identified, site marked, surgical consent, pre-op evaluation, timeout performed, IV checked, risks and benefits discussed and monitors and equipment checked Spinal Block Patient position: sitting Prep: DuraPrep Patient monitoring: heart rate, continuous pulse ox and blood pressure Approach: midline Location: L4-5 Injection technique: single-shot Needle Needle type: Pencan  Needle gauge: 24 G Needle length: 9 cm Assessment Sensory level: T6 Additional Notes Expiration date of kit checked and confirmed. Patient tolerated procedure well, without complications.

## 2018-08-06 NOTE — Progress Notes (Signed)
Assisted Dr. Howze with right, ultrasound guided, adductor canal block. Side rails up, monitors on throughout procedure. See vital signs in flow sheet. Tolerated Procedure well.  

## 2018-08-06 NOTE — Plan of Care (Signed)
Plan of care 

## 2018-08-06 NOTE — Discharge Instructions (Signed)
° °Dr. Frank Aluisio °Total Joint Specialist °[facility] °[address] °[place], [place] [address] °[phone] ° °TOTAL KNEE REPLACEMENT POSTOPERATIVE DIRECTIONS ° °Knee Rehabilitation, Guidelines Following Surgery  °Results after knee surgery are often greatly improved when you follow the exercise, range of motion and muscle strengthening exercises prescribed by your doctor. Safety measures are also important to protect the knee from further injury. Any time any of these exercises cause you to have increased pain or swelling in your knee joint, decrease the amount until you are comfortable again and slowly increase them. If you have problems or questions, call your caregiver or physical therapist for advice.  ° °HOME CARE INSTRUCTIONS  °• Remove items at home which could result in a fall. This includes throw rugs or furniture in walking pathways.  °· ICE to the affected knee every three hours for 30 minutes at a time and then as needed for pain and swelling.  Continue to use ice on the knee for pain and swelling from surgery. You may notice swelling that will progress down to the foot and ankle.  This is normal after surgery.  Elevate the leg when you are not up walking on it.   °· Continue to use the breathing machine which will help keep your temperature down.  It is common for your temperature to cycle up and down following surgery, especially at night when you are not up moving around and exerting yourself.  The breathing machine keeps your lungs expanded and your temperature down. °· Do not place pillow under knee, focus on keeping the knee straight while resting ° °DIET °You may resume your previous home diet once your are discharged from the hospital. ° °DRESSING / WOUND CARE / SHOWERING °You may shower 3 days after surgery, but keep the wounds dry during showering.  You may use an occlusive plastic wrap (Press'n Seal for example), NO SOAKING/SUBMERGING IN THE BATHTUB.  If the bandage  gets wet, change with a clean dry gauze.  If the incision gets wet, pat the wound dry with a clean towel. °You may start showering once you are discharged home but do not submerge the incision under water. Just pat the incision dry and apply a dry gauze dressing on daily. °Change the surgical dressing daily and reapply a dry dressing each time. ° °ACTIVITY °Walk with your walker as instructed. °Use walker as long as suggested by your caregivers. °Avoid periods of inactivity such as sitting longer than an hour when not asleep. This helps prevent blood clots.  °You may resume a sexual relationship in one month or when given the OK by your doctor.  °You may return to work once you are cleared by your doctor.  °Do not drive a car for 6 weeks or until released by you surgeon.  °Do not drive while taking narcotics. ° °WEIGHT BEARING °Weight bearing as tolerated with assist device (walker, cane, etc) as directed, use it as long as suggested by your surgeon or therapist, typically at least 4-6 weeks. ° °POSTOPERATIVE CONSTIPATION PROTOCOL °Constipation - defined medically as fewer than three stools per week and severe constipation as less than one stool per week. ° °One of the most common issues patients have following surgery is constipation.  Even if you have a regular bowel pattern at home, your normal regimen is likely to be disrupted due to multiple reasons following surgery.  Combination of anesthesia, postoperative narcotics, change in appetite and fluid intake all can affect your bowels.    In order to avoid complications following surgery, here are some recommendations in order to help you during your recovery period. ° °Colace (docusate) - Pick up an over-the-counter form of Colace or another stool softener and take twice a day as long as you are requiring postoperative pain medications.  Take with a full glass of water daily.  If you experience loose stools or diarrhea, hold the colace until you stool forms back  up.  If your symptoms do not get better within 1 week or if they get worse, check with your doctor. ° °Dulcolax (bisacodyl) - Pick up over-the-counter and take as directed by the product packaging as needed to assist with the movement of your bowels.  Take with a full glass of water.  Use this product as needed if not relieved by Colace only.  ° °MiraLax (polyethylene glycol) - Pick up over-the-counter to have on hand.  MiraLax is a solution that will increase the amount of water in your bowels to assist with bowel movements.  Take as directed and can mix with a glass of water, juice, soda, coffee, or tea.  Take if you go more than two days without a movement. °Do not use MiraLax more than once per day. Call your doctor if you are still constipated or irregular after using this medication for 7 days in a row. ° °If you continue to have problems with postoperative constipation, please contact the office for further assistance and recommendations.  If you experience "the worst abdominal pain ever" or develop nausea or vomiting, please contact the office immediatly for further recommendations for treatment. ° °ITCHING ° If you experience itching with your medications, try taking only a single pain pill, or even half a pain pill at a time.  You can also use Benadryl over the counter for itching or also to help with sleep.  ° °TED HOSE STOCKINGS °Wear the elastic stockings on both legs for three weeks following surgery during the day but you may remove then at night for sleeping. ° °MEDICATIONS °See your medication summary on the “After Visit Summary” that the nursing staff will review with you prior to discharge.  You may have some home medications which will be placed on hold until you complete the course of blood thinner medication.  It is important for you to complete the blood thinner medication as prescribed by your surgeon.  Continue your approved medications as instructed at time of discharge. ° °PRECAUTIONS °If  you experience chest pain or shortness of breath - call 911 immediately for transfer to the hospital emergency department.  °If you develop a fever greater that 101 F, purulent drainage from wound, increased redness or drainage from wound, foul odor from the wound/dressing, or calf pain - CONTACT YOUR SURGEON.   °                                                °FOLLOW-UP APPOINTMENTS °Make sure you keep all of your appointments after your operation with your surgeon and caregivers. You should call the office at the above phone number and make an appointment for approximately two weeks after the date of your surgery or on the date instructed by your surgeon outlined in the "After Visit Summary". ° ° °RANGE OF MOTION AND STRENGTHENING EXERCISES  °Rehabilitation of the knee is important following a knee injury or   an operation. After just a few days of immobilization, the muscles of the thigh which control the knee become weakened and shrink (atrophy). Knee exercises are designed to build up the tone and strength of the thigh muscles and to improve knee motion. Often times heat used for twenty to thirty minutes before working out will loosen up your tissues and help with improving the range of motion but do not use heat for the first two weeks following surgery. These exercises can be done on a training (exercise) mat, on the floor, on a table or on a bed. Use what ever works the best and is most comfortable for you Knee exercises include:  °• Leg Lifts - While your knee is still immobilized in a splint or cast, you can do straight leg raises. Lift the leg to 60 degrees, hold for 3 sec, and slowly lower the leg. Repeat 10-20 times 2-3 times daily. Perform this exercise against resistance later as your knee gets better.  °• Quad and Hamstring Sets - Tighten up the muscle on the front of the thigh (Quad) and hold for 5-10 sec. Repeat this 10-20 times hourly. Hamstring sets are done by pushing the foot backward against an  object and holding for 5-10 sec. Repeat as with quad sets.  °· Leg Slides: Lying on your back, slowly slide your foot toward your buttocks, bending your knee up off the floor (only go as far as is comfortable). Then slowly slide your foot back down until your leg is flat on the floor again. °· Angel Wings: Lying on your back spread your legs to the side as far apart as you can without causing discomfort.  °A rehabilitation program following serious knee injuries can speed recovery and prevent re-injury in the future due to weakened muscles. Contact your doctor or a physical therapist for more information on knee rehabilitation.  ° °IF YOU ARE TRANSFERRED TO A SKILLED REHAB FACILITY °If the patient is transferred to a skilled rehab facility following release from the hospital, a list of the current medications will be sent to the facility for the patient to continue.  When discharged from the skilled rehab facility, please have the facility set up the patient's Home Health Physical Therapy prior to being released. Also, the skilled facility will be responsible for providing the patient with their medications at time of release from the facility to include their pain medication, the muscle relaxants, and their blood thinner medication. If the patient is still at the rehab facility at time of the two week follow up appointment, the skilled rehab facility will also need to assist the patient in arranging follow up appointment in our office and any transportation needs. ° °MAKE SURE YOU:  °• Understand these instructions.  °• Get help right away if you are not doing well or get worse.  ° ° °Pick up stool softner and laxative for home use following surgery while on pain medications. °Do not submerge incision under water. °Please use good hand washing techniques while changing dressing each day. °May shower starting three days after surgery. °Please use a clean towel to pat the incision dry following showers. °Continue to  use ice for pain and swelling after surgery. °Do not use any lotions or creams on the incision until instructed by your surgeon. ° °

## 2018-08-06 NOTE — Anesthesia Postprocedure Evaluation (Signed)
Anesthesia Post Note  Patient: [name] [name]  Procedure(s) Performed: TOTAL KNEE ARTHROPLASTY (Right )     Patient location during evaluation: PACU Anesthesia Type: Spinal Level of consciousness: awake and alert Pain management: pain level controlled Vital Signs Assessment: post-procedure vital signs reviewed and stable Respiratory status: spontaneous breathing, nonlabored ventilation and respiratory function stable Cardiovascular status: blood pressure returned to baseline and stable Postop Assessment: no apparent nausea or vomiting and spinal receding Anesthetic complications: no    Last Vitals:  Vitals:   08/06/18 1400 08/06/18 1415  BP: (!) 142/100 (!) 133/101  Pulse: 78 68  Resp: 16 10  Temp:    SpO2: 97% 100%    Last Pain:  Vitals:   08/06/18 1330  TempSrc:   PainSc: Asleep                 Kaylyn Layer

## 2018-08-06 NOTE — Interval H&P Note (Signed)
History and Physical Interval Note:  08/06/2018 9:30 AM  [name]  has presented today for surgery, with the diagnosis of right knee osteoarthritis  The various methods of treatment have been discussed with the patient and family. After consideration of risks, benefits and other options for treatment, the patient has consented to  Procedure(s): TOTAL KNEE ARTHROPLASTY (Right) as a surgical intervention .  The patient's history has been reviewed, patient examined, no change in status, stable for surgery.  I have reviewed the patient's chart and labs.  Questions were answered to the patient's satisfaction.     [name] [name] Ermelinda Eckert

## 2018-08-06 NOTE — Op Note (Signed)
OPERATIVE REPORT-TOTAL KNEE ARTHROPLASTY   Pre-operative diagnosis- Osteoarthritis  Right knee(s)  Post-operative diagnosis- Osteoarthritis Right Knee  Procedure-  Right  Total Knee Arthroplasty (Depuy Attune)  Surgeon- Gus Rankin. Robt Okuda, MD  Assistant- Dimitri Ped, PA-C   Anesthesia-  Adductor canal block and spinal  EBL-50 mL   Drains Hemovac  Tourniquet time-  Total Tourniquet Time Documented: Thigh (Right) - 41 minutes Total: Thigh (Right) - 41 minutes     Complications- None  Condition-PACU - hemodynamically stable.   Brief Clinical Note  [name] [name] is a 58 y.o. year old male with end stage OA of his right knee with progressively worsening pain and dysfunction. He has constant pain, with activity and at rest and significant functional deficits with difficulties even with ADLs. He has had extensive non-op management including analgesics, injections of cortisone and viscosupplements, and home exercise program, but remains in significant pain with significant dysfunction. Radiographs show bone on bone arthritis medial and patellofemoral. He presents now for right Total Knee Arthroplasty.    Procedure in detail---   The patient is brought into the operating room and positioned supine on the operating table. After successful administration of  Adductor canal block and spinal,   a tourniquet is placed high on the  Right thigh(s) and the lower extremity is prepped and draped in the usual sterile fashion. Time out is performed by the operating team and then the  Right lower extremity is wrapped in Esmarch, knee flexed and the tourniquet inflated to 300 mmHg.       A midline incision is made with a ten blade through the subcutaneous tissue to the level of the extensor mechanism. A fresh blade is used to make a medial parapatellar arthrotomy. Soft tissue over the proximal medial tibia is subperiosteally elevated to the joint line with a knife and into the semimembranosus  bursa with a Cobb elevator. Soft tissue over the proximal lateral tibia is elevated with attention being paid to avoiding the patellar tendon on the tibial tubercle. The patella is everted, knee flexed 90 degrees and the ACL and PCL are removed. Findings are bone on bone medial and patellofemoral with large global osteophytes.        The drill is used to create a starting hole in the distal femur and the canal is thoroughly irrigated with sterile saline to remove the fatty contents. The 5 degree Right  valgus alignment guide is placed into the femoral canal and the distal femoral cutting block is pinned to remove 9 mm off the distal femur. Resection is made with an oscillating saw.      The tibia is subluxed forward and the menisci are removed. The extramedullary alignment guide is placed referencing proximally at the medial aspect of the tibial tubercle and distally along the second metatarsal axis and tibial crest. The block is pinned to remove 74mm off the more deficient medial  side. Resection is made with an oscillating saw. Size 7is the most appropriate size for the tibia and the proximal tibia is prepared with the modular drill and keel punch for that size.      The femoral sizing guide is placed and size 7 is most appropriate. Rotation is marked off the epicondylar axis and confirmed by creating a rectangular flexion gap at 90 degrees. The size 7 cutting block is pinned in this rotation and the anterior, posterior and chamfer cuts are made with the oscillating saw. The intercondylar block is then placed and that cut is made.  Trial size 7 tibial component, trial size 7 posterior stabilized femur and a 8  mm posterior stabilized rotating platform insert trial is placed. Full extension is achieved with excellent varus/valgus and anterior/posterior balance throughout full range of motion. The patella is everted and thickness measured to be 25  mm. Free hand resection is taken to 15 mm, a 41 template is  placed, lug holes are drilled, trial patella is placed, and it tracks normally. Osteophytes are removed off the posterior femur with the trial in place. All trials are removed and the cut bone surfaces prepared with pulsatile lavage. Cement is mixed and once ready for implantation, the size 7 tibial implant, size  7 posterior stabilized femoral component, and the size 41 patella are cemented in place and the patella is held with the clamp. The trial insert is placed and the knee held in full extension. The Exparel (20 ml mixed with 60 ml saline) is injected into the extensor mechanism, posterior capsule, medial and lateral gutters and subcutaneous tissues.  All extruded cement is removed and once the cement is hard the permanent 8 mm posterior stabilized rotating platform insert is placed into the tibial tray.      The wound is copiously irrigated with saline solution and the extensor mechanism closed over a hemovac drain with #1 V-loc suture. The tourniquet is released for a total tourniquet time of 41  minutes. Flexion against gravity is 140 degrees and the patella tracks normally. Subcutaneous tissue is closed with 2.0 vicryl and subcuticular with running 4.0 Monocryl. The incision is cleaned and dried and steri-strips and a bulky sterile dressing are applied. The limb is placed into a knee immobilizer and the patient is awakened and transported to recovery in stable condition.      Please note that a surgical assistant was a medical necessity for this procedure in order to perform it in a safe and expeditious manner. Surgical assistant was necessary to retract the ligaments and vital neurovascular structures to prevent injury to them and also necessary for proper positioning of the limb to allow for anatomic placement of the prosthesis.   Gus Rankin Rudolph Dobler, MD    08/06/2018, 12:39 PM

## 2018-08-07 ENCOUNTER — Encounter (HOSPITAL_COMMUNITY): Payer: Self-pay | Admitting: Orthopedic Surgery

## 2018-08-07 LAB — CBC
HCT: 39.6 % (ref 39.0–52.0)
Hemoglobin: 12.6 g/dL — ABNORMAL LOW (ref 13.0–17.0)
MCH: 29.2 pg (ref 26.0–34.0)
MCHC: 31.8 g/dL (ref 30.0–36.0)
MCV: 91.9 fL (ref 80.0–100.0)
Platelets: 189 10*3/uL (ref 150–400)
RBC: 4.31 MIL/uL (ref 4.22–5.81)
RDW: 13.1 % (ref 11.5–15.5)
WBC: 7.3 10*3/uL (ref 4.0–10.5)
nRBC: 0 % (ref 0.0–0.2)

## 2018-08-07 LAB — BASIC METABOLIC PANEL
Anion gap: 7 (ref 5–15)
BUN: 15 mg/dL (ref 6–20)
CO2: 26 mmol/L (ref 22–32)
Calcium: 8.8 mg/dL — ABNORMAL LOW (ref 8.9–10.3)
Chloride: 104 mmol/L (ref 98–111)
Creatinine, Ser: 0.97 mg/dL (ref 0.61–1.24)
GFR calc Af Amer: >60 mL/min (ref >60)
GFR calc non Af Amer: >60 mL/min (ref >60)
Glucose, Bld: 152 mg/dL — ABNORMAL HIGH (ref 70–99)
POTASSIUM: 3.9 mmol/L (ref 3.5–5.1)
Sodium: 137 mmol/L (ref 135–145)

## 2018-08-07 MED ORDER — METHOCARBAMOL 500 MG PO TABS: 500.0000 mg | ORAL_TABLET | Freq: Four times a day (QID) | ORAL | 0 refills | Status: DC | PRN

## 2018-08-07 MED ORDER — TRAMADOL HCL 50 MG PO TABS: 50.0000 mg | ORAL_TABLET | Freq: Four times a day (QID) | ORAL | 0 refills | Status: DC | PRN

## 2018-08-07 MED ORDER — GABAPENTIN 300 MG PO CAPS: 300.0000 mg | ORAL_CAPSULE | Freq: Three times a day (TID) | ORAL | 0 refills | Status: DC

## 2018-08-07 MED ORDER — ASPIRIN 325 MG PO TBEC: 325.0000 mg | DELAYED_RELEASE_TABLET | Freq: Two times a day (BID) | ORAL | 0 refills | Status: AC

## 2018-08-07 MED ORDER — OXYCODONE HCL 5 MG PO TABS: 5.0000 mg | ORAL_TABLET | Freq: Four times a day (QID) | ORAL | 0 refills | Status: DC | PRN

## 2018-08-07 NOTE — Progress Notes (Signed)
Physical Therapy Treatment Patient Details Name: [name] [name] MRN: [id number] DOB: 30-Jul-1960 Today's Date: 08/07/2018    History of Present Illness 58 yo male s/p R TKR on 08/06/18. PMH includes resection of terminal ileum, ETOH abuse, heart murmur.     PT Comments    POD# 1  pm session Spouse and mom present.  Instructed on KI use and proper application.  Pt unable to perform SLR so educated on KI use for amb and stairs then to discontinue use when able to perform SLR.  Assisted OOB. Demonstrated and instructed pt how to use belt to self assist LE as a leg lifter.  Assisted with amb a greater distance in hallway.  Practiced stairs twice with family present. Then returned to room to perform remaining TE's following HEP handout.  Instructed on proper tech, freq as well as use of ICE.  Addressed all mobility questions, discussed appropriate activity, educated on use of ICE.  Pt ready for D/C to home.      Follow Up Recommendations  Follow surgeon's recommendation for DC plan and follow-up therapies;Supervision for mobility/OOB(OP)     Equipment Recommendations  None recommended by PT    Recommendations for Other Services       Precautions / Restrictions Precautions Precautions: Fall Precaution Comments: instructed/educated on use and proper application Required Braces or Orthoses: Knee Immobilizer - Right Knee Immobilizer - Right: On when out of bed or walking;Discontinue once straight leg raise with < 10 degree lag Restrictions Weight Bearing Restrictions: No Other Position/Activity Restrictions: WBAT     Mobility  Bed Mobility Overal bed mobility: Needs Assistance Bed Mobility: Supine to Sit;Sit to Supine     Supine to sit: Supervision;Min guard Sit to supine: Supervision;Min guard   General bed mobility comments: demonstared and instructed how to use belt to self assist R LE off and onto bed  Transfers Overall transfer level: Needs assistance Equipment used:  Rolling walker (2 wheeled) Transfers: Sit to/from [employer] Sit to Stand: Min guard;From elevated surface;Supervision Stand pivot transfers: Min guard       General transfer comment: 25% VC's on proper hand placement and increased time as well as instruction to extend R LE prior to sit  Ambulation/Gait Ambulation/Gait assistance: Supervision;Min guard Gait Distance (Feet): 68 Feet Assistive device: Rolling walker (2 wheeled) Gait Pattern/deviations: Step-to pattern;Decreased stance time - right;Decreased weight shift to right;Antalgic;Trunk flexed Gait velocity: decreased   General Gait Details: 50% VC's on proper walker to self distance and upright posture.  Pt amb with KI with instruction to increase WBing R LE and safety with turns.    Stairs Stairs: Yes Stairs assistance: Supervision;Min guard Stair Management: Two rails;Step to pattern;Forwards Number of Stairs: 2 General stair comments: performed twice with spouse and mother present for education.  Also pt instructed to wear KI for increased knee support.    Wheelchair Mobility    Modified Rankin (Stroke Patients Only)       Balance                                            Cognition Arousal/Alertness: Awake/alert Behavior During Therapy: WFL for tasks assessed/performed Overall Cognitive Status: Within Functional Limits for tasks assessed  General Comments: pt with slight retention of new information and required repeat instuction esp on safety.      Exercises  5 reps of all seated TE following HEP handout.    General Comments        Pertinent Vitals/Pain Pain Assessment: 0-10 Pain Score: 4  Pain Location: R knee Pain Descriptors / Indicators: Discomfort;Grimacing;Tender Pain Intervention(s): Monitored during session;Repositioned;Relaxation;Ice applied    Home Living                      Prior Function             PT Goals (current goals can now be found in the care plan section) Progress towards PT goals: Progressing toward goals    Frequency    7X/week      PT Plan Current plan remains appropriate    Co-evaluation              AM-PAC PT "6 Clicks" Mobility   Outcome Measure  Help needed turning from your back to your side while in a flat bed without using bedrails?: A Little Help needed moving from lying on your back to sitting on the side of a flat bed without using bedrails?: A Little Help needed moving to and from a bed to a chair (including a wheelchair)?: A Little Help needed standing up from a chair using your arms (e.g., wheelchair or bedside chair)?: A Little Help needed to walk in hospital room?: A Little Help needed climbing 3-5 steps with a railing? : A Little 6 Click Score: 18    End of Session Equipment Utilized During Treatment: Gait belt;Right knee immobilizer Activity Tolerance: Patient tolerated treatment well Patient left: in bed;with bed alarm set;with call bell/phone within reach;with family/visitor present Nurse Communication: Mobility status PT Visit Diagnosis: Other abnormalities of gait and mobility (R26.89);Difficulty in walking, not elsewhere classified (R26.2)     Time: 1310-1355 PT Time Calculation (min) (ACUTE ONLY): 45 min  Charges:  $Gait Training: 8-22 mins $Therapeutic Exercise: 8-22 mins $Self Care/Home Management: 8-22                     Felecia Shelling  PTA Acute  Rehabilitation Services Pager      [phone] Office      [phone]

## 2018-08-07 NOTE — Progress Notes (Signed)
Physical Therapy Treatment Patient Details Name: [name] [name] MRN: [id number] DOB: 27-Dec-1960 Today's Date: 08/07/2018    History of Present Illness 58 yo male s/p R TKR on 08/06/18. PMH includes resection of terminal ileum, ETOH abuse, heart murmur.     PT Comments    POD # 1 am session Applied KI as pt was unable to perform SLR.  Educated on proper application and use esp for stairs.  Assisted OOB.  General bed mobility comments: demonstared and instructed how to use belt to self assist R LE off and onto bed.  General transfer comment: 50% VC's on proper hand placement and increased time as well as instruction to extend R LE prior to sit.  General Gait Details: 50% VC's on proper walker to self distance and upright posture.  Pt amb with KI with instruction to increase WBing R LE and safety with turns. Then returned to room to perform some TE's following HEP handout.  Instructed on proper tech, freq as well as use of ICE.     Follow Up Recommendations  Follow surgeon's recommendation for DC plan and follow-up therapies;Supervision for mobility/OOB(OP)     Equipment Recommendations  None recommended by PT    Recommendations for Other Services       Precautions / Restrictions Precautions Precautions: Fall Precaution Comments: instructed/educated on use and proper application Required Braces or Orthoses: Knee Immobilizer - Right Knee Immobilizer - Right: On when out of bed or walking;Discontinue once straight leg raise with < 10 degree lag Restrictions Weight Bearing Restrictions: No Other Position/Activity Restrictions: WBAT     Mobility  Bed Mobility Overal bed mobility: Needs Assistance Bed Mobility: Supine to Sit;Sit to Supine     Supine to sit: Supervision;Min guard Sit to supine: Supervision;Min guard   General bed mobility comments: demonstared and instructed how to use belt to self assist R LE off and onto bed  Transfers Overall transfer level: Needs  assistance Equipment used: Rolling walker (2 wheeled) Transfers: Sit to/from [employer] Sit to Stand: Min guard;From elevated surface;Supervision Stand pivot transfers: Min guard       General transfer comment: 50% VC's on proper hand placement and increased time as well as instruction to extend R LE prior to sit  Ambulation/Gait Ambulation/Gait assistance: Supervision;Min guard Gait Distance (Feet): 68 Feet Assistive device: Rolling walker (2 wheeled) Gait Pattern/deviations: Step-to pattern;Decreased stance time - right;Decreased weight shift to right;Antalgic;Trunk flexed Gait velocity: decreased   General Gait Details: 50% VC's on proper walker to self distance and upright posture.  Pt amb with KI with instruction to increase WBing R LE and safety with turns.    Stairs             Wheelchair Mobility    Modified Rankin (Stroke Patients Only)       Balance                                            Cognition Arousal/Alertness: Awake/alert Behavior During Therapy: WFL for tasks assessed/performed Overall Cognitive Status: Within Functional Limits for tasks assessed                                 General Comments: pt with slight retention of new information and required repeat instuction esp on safety.      Exercises  Total Knee Replacement TE's 10 reps B LE ankle pumps 10 reps towel squeezes 10 reps knee presses 10 reps heel slides   Followed by ICE    General Comments        Pertinent Vitals/Pain Pain Assessment: 0-10 Pain Score: 4  Pain Location: R knee Pain Descriptors / Indicators: Discomfort;Grimacing;Tender Pain Intervention(s): Monitored during session;Repositioned;Relaxation;Ice applied    Home Living                      Prior Function            PT Goals (current goals can now be found in the care plan section) Progress towards PT goals: Progressing toward goals     Frequency    7X/week      PT Plan Current plan remains appropriate    Co-evaluation              AM-PAC PT "6 Clicks" Mobility   Outcome Measure  Help needed turning from your back to your side while in a flat bed without using bedrails?: A Little Help needed moving from lying on your back to sitting on the side of a flat bed without using bedrails?: A Little Help needed moving to and from a bed to a chair (including a wheelchair)?: A Little Help needed standing up from a chair using your arms (e.g., wheelchair or bedside chair)?: A Little Help needed to walk in hospital room?: A Little Help needed climbing 3-5 steps with a railing? : A Little 6 Click Score: 18    End of Session Equipment Utilized During Treatment: Gait belt;Right knee immobilizer Activity Tolerance: Patient tolerated treatment well Patient left: in bed;with bed alarm set;with call bell/phone within reach;with family/visitor present Nurse Communication: Mobility status PT Visit Diagnosis: Other abnormalities of gait and mobility (R26.89);Difficulty in walking, not elsewhere classified (R26.2)     Time: 2549-8264 PT Time Calculation (min) (ACUTE ONLY): 25 min  Charges:  $Gait Training: 8-22 mins $Therapeutic Exercise: 8-22 mins                     Felecia Shelling  PTA Acute  Rehabilitation Services Pager      [phone] Office      [phone]

## 2018-08-07 NOTE — Progress Notes (Signed)
   Subjective: 1 Day Post-Op Procedure(s) (LRB): TOTAL KNEE ARTHROPLASTY (Right) Patient reports pain as mild.   Patient seen in rounds by Dr. Lequita Halt. Patient is well, and has had no acute complaints or problems. States he is ready to go home. No issues overnight. Denies chest pain, SOB, or calf pain. Foley catheter removed this AM.  We will continue therapy today.   Objective: Vital signs in last 24 hours: Temp:  [97.4 F (36.3 C)-98.3 F (36.8 C)] 97.7 F (36.5 C) (02/25 0644) Pulse Rate:  [61-103] 71 (02/25 0644) Resp:  [9-20] 19 (02/25 0644) BP: (113-161)/(83-131) 135/86 (02/25 0644) SpO2:  [95 %-100 %] 100 % (02/25 0644) Weight:  [94.4 kg] 94.4 kg (02/24 1617)  Intake/Output from previous day:  Intake/Output Summary (Last 24 hours) at 08/07/2018 0729 Last data filed at 08/07/2018 0650 Gross per 24 hour  Intake 5176.58 ml  Output 3910 ml  Net 1266.58 ml    Labs: Recent Labs    08/07/18 0512  HGB 12.6*   Recent Labs    08/07/18 0512  WBC 7.3  RBC 4.31  HCT 39.6  PLT 189   Recent Labs    08/07/18 0512  NA 137  K 3.9  CL 104  CO2 26  BUN 15  CREATININE 0.97  GLUCOSE 152*  CALCIUM 8.8*   Exam: General - Patient is Alert and Oriented Extremity - Neurologically intact Neurovascular intact Sensation intact distally Dorsiflexion/Plantar flexion intact Dressing - dressing C/D/I Motor Function - intact, moving foot and toes well on exam.   Past Medical History:  Diagnosis Date  . Arthritis   . Environmental allergies   . External hemorrhoid   . Heart murmur    asymptomatic  . Hypertension     Assessment/Plan: 1 Day Post-Op Procedure(s) (LRB): TOTAL KNEE ARTHROPLASTY (Right) Principal Problem:   OA (osteoarthritis) of knee Active Problems:   Osteoarthritis of right knee  Estimated body mass index is 30.73 kg/m as calculated from the following:   Height as of this encounter: 5\' 9"  (1.753 m).   Weight as of this encounter: 94.4 kg. Advance  diet Up with therapy D/C IV fluids  Anticipated LOS equal to or greater than 2 midnights due to - Age 58 and older with one or more of the following:  - Obesity  - Expected need for hospital services (PT, OT, Nursing) required for safe  discharge  - Anticipated need for postoperative skilled nursing care or inpatient rehab  - Active co-morbidities: None OR   - Unanticipated findings during/Post Surgery: None  - Patient is a high risk of re-admission due to: None    DVT Prophylaxis - Aspirin Weight bearing as tolerated. D/C O2 and pulse ox and try on room air. Hemovac pulled without difficulty, will continue therapy today.  Plan is to go Home after hospital stay. Plan for discharge today once meeting goals with therapy. Scheduled for outpatient physical therapy. Follow-up in the office in 2 weeks.   Arther Abbott, PA-C Orthopedic Surgery 08/07/2018, 7:29 AM

## 2018-08-07 NOTE — Care Management Note (Signed)
Case Management Note  Patient Details  Name: Kinnith Omdahl MRN: [id number] Date of Birth: 12-Dec-1960  Subjective/Objective:                  Discharge planning  Action/Plan: [facility] and 3 in 1 Expected Discharge Date:  08/07/18               Expected Discharge Plan:     In-House Referral:     Discharge planning Services     Post Acute Care Choice:    Choice offered to:     DME Arranged:    DME Agency:     HH Arranged:    HH Agency:     Status of Service:     If discussed at [employer], dates discussed:    Additional Comments:  Golda Acre, RN 08/07/2018, 10:44 AM

## 2018-08-08 NOTE — Discharge Summary (Signed)
Physician Discharge Summary   Patient ID: [name] [name] MRN: [id number] DOB/AGE: 58-Sep-1962 58 y.o.  Admit date: 08/06/2018 Discharge date: 08/07/2018  Primary Diagnosis: Osteoarthritis, right knee   Admission Diagnoses:  Past Medical History:  Diagnosis Date  . Arthritis   . Environmental allergies   . External hemorrhoid   . Heart murmur    asymptomatic  . Hypertension    Discharge Diagnoses:   Principal Problem:   OA (osteoarthritis) of knee Active Problems:   Osteoarthritis of right knee  Estimated body mass index is 30.73 kg/m as calculated from the following:   Height as of this encounter: 5\' 9"  (1.753 m).   Weight as of this encounter: 94.4 kg.  Procedure:  Procedure(s) (LRB): TOTAL KNEE ARTHROPLASTY (Right)   Consults: None  HPI: [name] [name] is a 58 y.o. year old male with end stage OA of his right knee with progressively worsening pain and dysfunction. He has constant pain, with activity and at rest and significant functional deficits with difficulties even with ADLs. He has had extensive non-op management including analgesics, injections of cortisone and viscosupplements, and home exercise program, but remains in significant pain with significant dysfunction. Radiographs show bone on bone arthritis medial and patellofemoral. He presents now for right Total Knee Arthroplasty.  Laboratory Data: Admission on 08/06/2018, Discharged on 08/07/2018  Component Date Value Ref Range Status  . WBC 08/07/2018 7.3  4.0 - 10.5 K/uL Final  . RBC 08/07/2018 4.31  4.22 - 5.81 MIL/uL Final  . Hemoglobin 08/07/2018 12.6* 13.0 - 17.0 g/dL Final  . HCT 40/98/1191 39.6  39.0 - 52.0 % Final  . MCV 08/07/2018 91.9  80.0 - 100.0 fL Final  . MCH 08/07/2018 29.2  26.0 - 34.0 pg Final  . MCHC 08/07/2018 31.8  30.0 - 36.0 g/dL Final  . RDW 47/82/9562 13.1  11.5 - 15.5 % Final  . Platelets 08/07/2018 189  150 - 400 K/uL Final  . nRBC 08/07/2018 0.0  0.0 - 0.2 % Final   Performed at [facility], [address] [place], [place] [address]  . Sodium 08/07/2018 137  135 - 145 mmol/L Final  . Potassium 08/07/2018 3.9  3.5 - 5.1 mmol/L Final  . Chloride 08/07/2018 104  98 - 111 mmol/L Final  . CO2 08/07/2018 26  22 - 32 mmol/L Final  . Glucose, Bld 08/07/2018 152* 70 - 99 mg/dL Final  . BUN 57/84/6962 15  6 - 20 mg/dL Final  . Creatinine, Ser 08/07/2018 0.97  0.61 - 1.24 mg/dL Final  . Calcium 95/28/4132 8.8* 8.9 - 10.3 mg/dL Final  . GFR calc non Af Amer 08/07/2018 >60  >60 mL/min Final  . GFR calc Af Amer 08/07/2018 >60  >60 mL/min Final  . Anion gap 08/07/2018 7  5 - 15 Final   Performed at [facility], [address], [place], [place] [address]  Hospital Outpatient Visit on 07/31/2018  Component Date Value Ref Range Status  . MRSA, PCR 07/31/2018 NEGATIVE  NEGATIVE Final  . Staphylococcus aureus 07/31/2018 NEGATIVE  NEGATIVE Final   Comment: (NOTE) The Xpert SA Assay (FDA approved for NASAL specimens in patients 75 years of age and older), is one component of a comprehensive surveillance program. It is not intended to diagnose infection nor to guide or monitor treatment. Performed at [facility], [address] [place], [place] [address]   . aPTT 07/31/2018 28  24 - 36 seconds Final   Performed at [facility], [address], [place], [place] [address]  . WBC 07/31/2018 3.0* 4.0 - 10.5 K/uL Final  . RBC 07/31/2018 5.21  4.22 - 5.81 MIL/uL Final  . Hemoglobin 07/31/2018 15.2  13.0 - 17.0 g/dL Final  . HCT 60/45/4098 46.4  39.0 - 52.0 % Final  . MCV 07/31/2018 89.1  80.0 - 100.0 fL Final  . MCH 07/31/2018 29.2  26.0 - 34.0 pg Final  . MCHC 07/31/2018 32.8  30.0 - 36.0 g/dL Final  . RDW 11/91/4782 13.3  11.5 - 15.5 % Final  . Platelets 07/31/2018 228  150 - 400 K/uL Final  . nRBC 07/31/2018 0.0  0.0 - 0.2 % Final   Performed at [facility], [address], [place], [place] [address]  . Sodium 07/31/2018 141  135 - 145 mmol/L Final  . Potassium 07/31/2018 4.3  3.5 - 5.1 mmol/L Final  . Chloride 07/31/2018 103  98 - 111 mmol/L Final  . CO2 07/31/2018 28  22 - 32 mmol/L Final  . Glucose, Bld 07/31/2018 96  70 - 99 mg/dL Final  . BUN 30/86/5784 17  6 - 20 mg/dL Final  . Creatinine, Ser 07/31/2018 1.04  0.61 - 1.24 mg/dL Final  . Calcium 69/62/9528 9.6  8.9 - 10.3 mg/dL Final  . Total Protein 07/31/2018 7.8  6.5 - 8.1 g/dL Final  . Albumin 41/32/4401 4.7  3.5 - 5.0 g/dL Final  . AST 02/72/5366 34  15 - 41 U/L Final  . ALT 07/31/2018 34  0 - 44 U/L Final  . Alkaline Phosphatase 07/31/2018 46  38 - 126 U/L Final  . Total Bilirubin 07/31/2018 1.0  0.3 - 1.2 mg/dL Final  . GFR calc non Af Amer 07/31/2018 >60  >60 mL/min Final  . GFR calc Af Amer 07/31/2018 >60  >60 mL/min Final  . Anion gap 07/31/2018 10  5 - 15 Final   Performed at [facility], [address], [place], [place] [address]  . Prothrombin Time 07/31/2018 12.6  11.4 - 15.2 seconds Final  . INR 07/31/2018 0.96   Final   Performed at [facility], [address], [place], [place] [address]  . ABO/RH(D) 07/31/2018 A NEG   Final  . Antibody Screen 07/31/2018 NEG   Final  . Sample Expiration 07/31/2018 08/09/2018   Final  . Extend sample reason 07/31/2018    Final                   Value:NO TRANSFUSIONS OR PREGNANCY IN THE PAST 3 MONTHS Performed at [facility], [address] [place], [place] [address]   . ABO/RH(D) 07/31/2018    Final                   Value:A NEG Performed at [facility], [address] [place], [place] [address]   Clinical Support on 06/28/2018  Component Date Value Ref Range Status  . WBC Count 06/28/2018 2.8* 4.0 - 10.5 K/uL Final  . RBC 06/28/2018 5.38  4.22 - 5.81 MIL/uL Final  . Hemoglobin 06/28/2018 15.9  13.0 - 17.0 g/dL Final  . HCT 29/51/8841 48.0  39.0 - 52.0 %  Final  . MCV 06/28/2018 89.2  80.0 - 100.0 fL Final  . MCH 06/28/2018 29.6  26.0 - 34.0 pg Final  . MCHC 06/28/2018 33.1  30.0 - 36.0 g/dL Final  . RDW 66/11/3014 13.5  11.5 - 15.5 %  Final  . Platelet Count 06/28/2018 210  150 - 400 K/uL Final  . nRBC 06/28/2018 0.0  0.0 - 0.2 % Final  . Neutrophils Relative % 06/28/2018 41  % Final  . Neutro Abs 06/28/2018 1.1* 1.7 - 7.7 K/uL Final  . Lymphocytes Relative 06/28/2018 45  % Final  . Lymphs Abs 06/28/2018 1.3  0.7 - 4.0 K/uL Final  . Monocytes Relative 06/28/2018 11  % Final  . Monocytes Absolute 06/28/2018 0.3  0.1 - 1.0 K/uL Final  . Eosinophils Relative 06/28/2018 2  % Final  . Eosinophils Absolute 06/28/2018 0.1  0.0 - 0.5 K/uL Final  . Basophils Relative 06/28/2018 1  % Final  . Basophils Absolute 06/28/2018 0.0  0.0 - 0.1 K/uL Final  . Immature Granulocytes 06/28/2018 0  % Final  . Abs Immature Granulocytes 06/28/2018 0.00  0.00 - 0.07 K/uL Final   Performed at [facility] [place], [address], [place], [place] [address]  . Sodium 06/28/2018 141  135 - 145 mmol/L Final  . Potassium 06/28/2018 3.4* 3.5 - 5.1 mmol/L Final  . Chloride 06/28/2018 102  98 - 111 mmol/L Final  . CO2 06/28/2018 32  22 - 32 mmol/L Final  . Glucose, Bld 06/28/2018 99  70 - 99 mg/dL Final  . BUN 60/45/409801/16/2020 18  6 - 20 mg/dL Final  . Creatinine 11/91/478201/16/2020 1.09  0.61 - 1.24 mg/dL Final  . Calcium 95/62/130801/16/2020 9.6  8.9 - 10.3 mg/dL Final  . Total Protein 06/28/2018 7.4  6.5 - 8.1 g/dL Final  . Albumin 65/78/469601/16/2020 4.9  3.5 - 5.0 g/dL Final  . AST 29/52/841301/16/2020 25  15 - 41 U/L Final  . ALT 06/28/2018 26  0 - 44 U/L Final  . Alkaline Phosphatase 06/28/2018 51  38 - 126 U/L Final  . Total Bilirubin 06/28/2018 0.8  0.3 - 1.2 mg/dL Final  . GFR, Est Non Af Am 06/28/2018 >60  >60 mL/min Final  . GFR, Est AFR Am 06/28/2018 >60  >60 mL/min Final  . Anion gap 06/28/2018 7  5 - 15 Final   Performed at [facility] at  [facility] [place], [address], [place], [place] [address]  . Smear Review 06/28/2018 SMEAR STAINED AND AVAILABLE FOR REVIEW   Final   Performed at [facility] [place], [address], [place], [place] [address]  . Copper 06/28/2018 88  72 - 166 ug/dL Final   Comment: (NOTE) This test was developed and its performance characteristics determined by [facility]. It has not been cleared or approved by the Food and Drug Administration.                                Detection Limit = 5 Performed At: [facility] [place] [address] [place], [place] [id number] Jolene SchimkeNagendra Sanjai MD QQ:[phone]   . Zinc 06/28/2018 86  56 - 134 ug/dL Final   Comment: (NOTE) This test was developed and its performance characteristics determined by [facility]. It has not been cleared or approved by the Food and Drug Administration.                                Detection Limit = 5 Performed At: [facility] [place] [address] [place], [place] [id number] Jolene SchimkeNagendra Sanjai MD  [id number]   . LDH 06/28/2018 224* 98 - 192 U/L Final   Performed at [facility], [address], [place], [place] [address]  . HIV Screen 4th Generation wRfx 06/28/2018 Non Reactive  Non Reactive Final   Comment: (NOTE) Performed At: [facility] [address] [place], [place] [id number] Jolene Schimke MD DZ:[id number]   . HCV Ab 06/28/2018 0.1  0.0 - 0.9 s/co ratio Final   Comment: (NOTE) Performed At: [facility] [address] [place], [place] [id number] Jolene Schimke MD LG:[id number]   . Hep B S Ab 06/28/2018 Reactive   Final   Comment: (NOTE)              Non Reactive: Inconsistent with immunity,                            less than 10 mIU/mL              Reactive:     Consistent with immunity,                            greater than 9.9 mIU/mL Performed At: [facility] [address] [place], [place] [id number] Jolene Schimke  MD [id number]   . Hepatitis B Surface Ag 06/28/2018 Negative  Negative Final   Comment: (NOTE) Performed At: [facility] [address] [place], [place] [id number] Jolene Schimke MD OI:[id number]   . Vitamin B-12 06/28/2018 267  180 - 914 pg/mL Final   Comment: (NOTE) This assay is not validated for testing neonatal or myeloproliferative syndrome specimens for Vitamin B12 levels. Performed at [facility], [address] [place], [place] [address]   . Folate 06/28/2018 11.7  >5.9 ng/mL Final   Performed at [facility], [address] [place], [place] [address]  . Comment: 06/28/2018 Comment   Final   Comment: (NOTE) Non reactive HCV antibody screen is consistent with no HCV infection, unless recent infection is suspected or other evidence exists to indicate HCV infection. Performed At: [facility] [address] [place], [place] [id number] Jolene Schimke MD PT:[id number]      X-Rays:No results found.  EKG:No orders found for this or any previous visit.   Hospital Course: [name] [name] is a 58 y.o. who was admitted to [facility] They were brought to the operating room on 08/06/2018 and underwent Procedure(s): TOTAL KNEE ARTHROPLASTY.  Patient tolerated the procedure well and was later transferred to the recovery room and then to the orthopaedic floor for postoperative care. They were given PO and IV analgesics for pain control following their surgery. They were given 24 hours of postoperative antibiotics of  Anti-infectives (From admission, onward)   Start     Dose/Rate Route Frequency Ordered Stop   08/06/18 1800  ceFAZolin (ANCEF) IVPB 2g/100 mL premix     2 g 200 mL/hr over 30 Minutes Intravenous Every 6 hours 08/06/18 1626 08/07/18 0059   08/06/18 0915  ceFAZolin (ANCEF) IVPB 2g/100 mL premix     2 g 200 mL/hr over 30 Minutes Intravenous On call to O.R. 08/06/18 7001 08/06/18 1133     and started  on DVT prophylaxis in the form of Aspirin.   PT and OT were ordered for total joint protocol. Discharge planning consulted to help with postop disposition and equipment needs.  Patient had a good night on the evening of  surgery. They started to get up OOB with therapy on POD #0. Pt was seen during rounds and was ready to go home pending progress with therapy. Hemovac drain was pulled without difficulty. He worked with therapy on POD #1 and was meeting his goals. Pt was discharged to home later that day in stable condition.  Diet: Regular diet Activity: WBAT Follow-up: in 2 weeks Disposition: Home with outpatient PT Discharged Condition: stable   Discharge Instructions    Call MD / Call 911   Complete by:  As directed    If you experience chest pain or shortness of breath, CALL 911 and be transported to the hospital emergency room.  If you develope a fever above 101 F, pus (white drainage) or increased drainage or redness at the wound, or calf pain, call your surgeon's office.   Change dressing   Complete by:  As directed    Change dressing on Wednesday, then change the dressing daily with sterile 4 x 4 inch gauze dressing and apply TED hose.   Constipation Prevention   Complete by:  As directed    Drink plenty of fluids.  Prune juice may be helpful.  You may use a stool softener, such as Colace (over the counter) 100 mg twice a day.  Use MiraLax (over the counter) for constipation as needed.   Diet - low sodium heart healthy   Complete by:  As directed    Discharge instructions   Complete by:  As directed    Dr. Ollen Gross Total Joint Specialist [facility] [address], [address] [place], [place] [address] [phone]  TOTAL KNEE REPLACEMENT POSTOPERATIVE DIRECTIONS  Knee Rehabilitation, Guidelines Following Surgery  Results after knee surgery are often greatly improved when you follow the exercise, range of motion and muscle strengthening exercises prescribed by your  doctor. Safety measures are also important to protect the knee from further injury. Any time any of these exercises cause you to have increased pain or swelling in your knee joint, decrease the amount until you are comfortable again and slowly increase them. If you have problems or questions, call your caregiver or physical therapist for advice.   HOME CARE INSTRUCTIONS  Remove items at home which could result in a fall. This includes throw rugs or furniture in walking pathways.  ICE to the affected knee every three hours for 30 minutes at a time and then as needed for pain and swelling.  Continue to use ice on the knee for pain and swelling from surgery. You may notice swelling that will progress down to the foot and ankle.  This is normal after surgery.  Elevate the leg when you are not up walking on it.   Continue to use the breathing machine which will help keep your temperature down.  It is common for your temperature to cycle up and down following surgery, especially at night when you are not up moving around and exerting yourself.  The breathing machine keeps your lungs expanded and your temperature down. Do not place pillow under knee, focus on keeping the knee straight while resting   DIET You may resume your previous home diet once your are discharged from the hospital.  DRESSING / WOUND CARE / SHOWERING You may shower 3 days after surgery, but keep the wounds dry during showering.  You may use an occlusive plastic wrap (Press'n Seal for example), NO SOAKING/SUBMERGING IN THE BATHTUB.  If the bandage gets wet, change with a clean dry gauze.  If the incision gets wet, pat the wound dry with a clean towel. You may start showering once you are discharged home but do not submerge the incision under water. Just pat the incision dry and apply a dry gauze dressing on daily. Change the surgical dressing daily and reapply a dry dressing each time.  ACTIVITY Walk with your walker as instructed. Use  walker as long as suggested by your caregivers. Avoid periods of inactivity such as sitting longer than an hour when not asleep. This helps prevent blood clots.  You may resume a sexual relationship in one month or when given the OK by your doctor.  You may return to work once you are cleared by your doctor.  Do not drive a car for 6 weeks or until released by you surgeon.  Do not drive while taking narcotics.  WEIGHT BEARING Weight bearing as tolerated with assist device (walker, cane, etc) as directed, use it as long as suggested by your surgeon or therapist, typically at least 4-6 weeks.  POSTOPERATIVE CONSTIPATION PROTOCOL Constipation - defined medically as fewer than three stools per week and severe constipation as less than one stool per week.  One of the most common issues patients have following surgery is constipation.  Even if you have a regular bowel pattern at home, your normal regimen is likely to be disrupted due to multiple reasons following surgery.  Combination of anesthesia, postoperative narcotics, change in appetite and fluid intake all can affect your bowels.  In order to avoid complications following surgery, here are some recommendations in order to help you during your recovery period.  Colace (docusate) - Pick up an over-the-counter form of Colace or another stool softener and take twice a day as long as you are requiring postoperative pain medications.  Take with a full glass of water daily.  If you experience loose stools or diarrhea, hold the colace until you stool forms back up.  If your symptoms do not get better within 1 week or if they get worse, check with your doctor.  Dulcolax (bisacodyl) - Pick up over-the-counter and take as directed by the product packaging as needed to assist with the movement of your bowels.  Take with a full glass of water.  Use this product as needed if not relieved by Colace only.   MiraLax (polyethylene glycol) - Pick up  over-the-counter to have on hand.  MiraLax is a solution that will increase the amount of water in your bowels to assist with bowel movements.  Take as directed and can mix with a glass of water, juice, soda, coffee, or tea.  Take if you go more than two days without a movement. Do not use MiraLax more than once per day. Call your doctor if you are still constipated or irregular after using this medication for 7 days in a row.  If you continue to have problems with postoperative constipation, please contact the office for further assistance and recommendations.  If you experience "the worst abdominal pain ever" or develop nausea or vomiting, please contact the office immediatly for further recommendations for treatment.  ITCHING  If you experience itching with your medications, try taking only a single pain pill, or even half a pain pill at a time.  You can also use Benadryl over the counter for itching or also to help with sleep.   TED HOSE STOCKINGS Wear the elastic stockings on both legs for three weeks following surgery during the day but you may remove then  at night for sleeping.  MEDICATIONS See your medication summary on the "After Visit Summary" that the nursing staff will review with you prior to discharge.  You may have some home medications which will be placed on hold until you complete the course of blood thinner medication.  It is important for you to complete the blood thinner medication as prescribed by your surgeon.  Continue your approved medications as instructed at time of discharge.  PRECAUTIONS If you experience chest pain or shortness of breath - call 911 immediately for transfer to the hospital emergency department.  If you develop a fever greater that 101 F, purulent drainage from wound, increased redness or drainage from wound, foul odor from the wound/dressing, or calf pain - CONTACT YOUR SURGEON.                                                   FOLLOW-UP  APPOINTMENTS Make sure you keep all of your appointments after your operation with your surgeon and caregivers. You should call the office at the above phone number and make an appointment for approximately two weeks after the date of your surgery or on the date instructed by your surgeon outlined in the "After Visit Summary".   RANGE OF MOTION AND STRENGTHENING EXERCISES  Rehabilitation of the knee is important following a knee injury or an operation. After just a few days of immobilization, the muscles of the thigh which control the knee become weakened and shrink (atrophy). Knee exercises are designed to build up the tone and strength of the thigh muscles and to improve knee motion. Often times heat used for twenty to thirty minutes before working out will loosen up your tissues and help with improving the range of motion but do not use heat for the first two weeks following surgery. These exercises can be done on a training (exercise) mat, on the floor, on a table or on a bed. Use what ever works the best and is most comfortable for you Knee exercises include:  Leg Lifts - While your knee is still immobilized in a splint or cast, you can do straight leg raises. Lift the leg to 60 degrees, hold for 3 sec, and slowly lower the leg. Repeat 10-20 times 2-3 times daily. Perform this exercise against resistance later as your knee gets better.  Quad and Hamstring Sets - Tighten up the muscle on the front of the thigh (Quad) and hold for 5-10 sec. Repeat this 10-20 times hourly. Hamstring sets are done by pushing the foot backward against an object and holding for 5-10 sec. Repeat as with quad sets.  Leg Slides: Lying on your back, slowly slide your foot toward your buttocks, bending your knee up off the floor (only go as far as is comfortable). Then slowly slide your foot back down until your leg is flat on the floor again. Angel Wings: Lying on your back spread your legs to the side as far apart as you can  without causing discomfort.  A rehabilitation program following serious knee injuries can speed recovery and prevent re-injury in the future due to weakened muscles. Contact your doctor or a physical therapist for more information on knee rehabilitation.   IF YOU ARE TRANSFERRED TO A SKILLED REHAB FACILITY If the patient is transferred to a skilled rehab facility following release from the hospital,  a list of the current medications will be sent to the facility for the patient to continue.  When discharged from the skilled rehab facility, please have the facility set up the patient's Home Health Physical Therapy prior to being released. Also, the skilled facility will be responsible for providing the patient with their medications at time of release from the facility to include their pain medication, the muscle relaxants, and their blood thinner medication. If the patient is still at the rehab facility at time of the two week follow up appointment, the skilled rehab facility will also need to assist the patient in arranging follow up appointment in our office and any transportation needs.  MAKE SURE YOU:  Understand these instructions.  Get help right away if you are not doing well or get worse.    Pick up stool softner and laxative for home use following surgery while on pain medications. Do not submerge incision under water. Please use good hand washing techniques while changing dressing each day. May shower starting three days after surgery. Please use a clean towel to pat the incision dry following showers. Continue to use ice for pain and swelling after surgery. Do not use any lotions or creams on the incision until instructed by your surgeon.   Do not put a pillow under the knee. Place it under the heel.   Complete by:  As directed    Driving restrictions   Complete by:  As directed    No driving for two weeks   TED hose   Complete by:  As directed    Use stockings (TED hose) for three  weeks on both leg(s).  You may remove them at night for sleeping.   Weight bearing as tolerated   Complete by:  As directed      Allergies as of 08/07/2018      Reactions   Latex Rash      Medication List    STOP taking these medications   MOBIC 15 MG tablet Generic drug:  meloxicam     TAKE these medications   acetaminophen 500 MG tablet Commonly known as:  TYLENOL Take 2,000 mg by mouth every 6 (six) hours as needed for moderate pain or headache.   amLODipine 10 MG tablet Commonly known as:  NORVASC Take 10 mg by mouth daily.   aspirin 325 MG EC tablet Take 1 tablet (325 mg total) by mouth 2 (two) times daily for 20 days. Then take one 81 mg aspirin once a day for three weeks. Then discontinue aspirin.   DEEP BLUE RELIEF EX Apply 1 application topically daily as needed (for back and knee pain).   gabapentin 300 MG capsule Commonly known as:  NEURONTIN Take 1 capsule (300 mg total) by mouth 3 (three) times daily. Take a 300 mg capsule three times a day for two weeks following surgery.Then take a 300 mg capsule two times a day for two weeks. Then take a 300 mg capsule once a day for two weeks. Then discontinue.   methocarbamol 500 MG tablet Commonly known as:  ROBAXIN Take 1 tablet (500 mg total) by mouth every 6 (six) hours as needed for muscle spasms.   oxyCODONE 5 MG immediate release tablet Commonly known as:  Oxy IR/ROXICODONE Take 1-2 tablets (5-10 mg total) by mouth every 6 (six) hours as needed for severe pain.   sildenafil 100 MG tablet Commonly known as:  VIAGRA Take 50 mg by mouth daily as needed for erectile dysfunction.  traMADol 50 MG tablet Commonly known as:  ULTRAM Take 1-2 tablets (50-100 mg total) by mouth every 6 (six) hours as needed for moderate pain.   triamterene-hydrochlorothiazide 37.5-25 MG tablet Commonly known as:  MAXZIDE-25 Take 1 tablet by mouth daily.   VENTOLIN HFA 108 (90 Base) MCG/ACT inhaler Generic drug:  albuterol Take 1  puff by mouth every 6 (six) hours as needed for wheezing or shortness of breath.   VITAMIN B-12 PO Take 1 tablet by mouth daily.            Discharge Care Instructions  (From admission, onward)         Start     Ordered   08/07/18 0000  Weight bearing as tolerated     08/07/18 0733   08/07/18 0000  Change dressing    Comments:  Change dressing on Wednesday, then change the dressing daily with sterile 4 x 4 inch gauze dressing and apply TED hose.   08/07/18 0211         Follow-up Information    Ollen Gross, MD. Schedule an appointment as soon as possible for a visit on 08/21/2018.   Specialty:  Orthopedic Surgery Contact information: [address] [place] [address] [phone]           Signed: Arther Abbott, PA-C Orthopedic Surgery 08/08/2018, 1:14 PM

## 2018-09-18 ENCOUNTER — Other Ambulatory Visit: Payer: Self-pay | Admitting: Cardiology

## 2018-09-18 DIAGNOSIS — I10 Essential (primary) hypertension: Secondary | ICD-10-CM

## 2018-09-19 ENCOUNTER — Encounter (HOSPITAL_COMMUNITY): Payer: Self-pay | Admitting: *Deleted

## 2018-09-19 ENCOUNTER — Other Ambulatory Visit: Payer: Self-pay

## 2018-09-19 NOTE — Progress Notes (Signed)
SPOKE W/  _patient      SCREENING SYMPTOMS OF COVID 19:   COUGH--no  RUNNY NOSE--- no  SORE THROAT---no  NASAL CONGESTION----no  SNEEZING----no  SHORTNESS OF BREATH---no  DIFFICULTY BREATHING---no  TEMP >100.4-----no  UNEXPLAINED BODY ACHES------no   HAVE YOU OR ANY FAMILY MEMBER TRAVELLED PAST 14 DAYS OUT OF THE   COUNTY--no- STATE----no COUNTRY----no  HAVE YOU OR ANY FAMILY MEMBER BEEN EXPOSED TO ANYONE WITH COVID 19?  no    

## 2018-09-20 NOTE — Progress Notes (Signed)
Anesthesia Chart Review    Case:  [id number] Date/Time:  09/24/18 0925   Procedure:  CLOSED MANIPULATION KNEE (Right ) - Negative to COVID19 screening   Anesthesia type:  Choice   Pre-op diagnosis:  right knee arthrofibrosis   Location:  [facility] ROOM 10 / [facility]   Surgeon:  Ollen Gross, MD      DISCUSSION:58 yo never smoker with h/o HTN, asymptomatic heart murmur, s/p right total knee arthroplasty 08/06/18 with right knee arthrofibrosis scheduled for above procedure 09/24/18 with Dr. Ollen Gross.   Prior to right TKA 08/06/18 was cleared by cardiology.  Seen by Dr. Florian Buff 06/26/18.  Per her note, "Results of stress nuclear scans and echocardiogram were explained to the patient and he was assured.  Stress nuclear scans did not reveal any ischemia.  Calculated LVEF was low on nuclear scans but it appeared normal visually.  Also, LVEF was normal on echocardiogram.  Patient can have knee surgery from cardiac standpoint, his risks are low."  Anesthesia records reviewed from 08/06/18 with no anesthesia complications noted.  Pt can proceed with planned procedure barring acute status change.  VS: There were no vitals taken for this visit.  PROVIDERS: Lindaann Pascal, PA-C is PCP   Florian Buff, MD is Cardiologist  LABS: Labs DOS  (all labs ordered are listed, but only abnormal results are displayed)  Labs Reviewed - No data to display   IMAGES:   EKG: 06/01/18 (on chart) Rate 73 bpm NSR, Normal ECG  CV: Echo 06/20/2018 Findings: Left ventricle cavity is normal in size.  Normal left ventricular shape.  Normal global wall motion.  Normal diastolic filling pattern.  Left atrial cavity is normal in size Right atrial cavity is normal in size Right ventricle cavity is normal in size.  Normal right ventricular function.  Structurally normal trileaflet aortic valve with no regurgitation noted.  Structurally normal mitral valve with no regurgitation noted Structurally normal tricuspid  valvel with trace regurgitation Structurally normal pulmonic valve with trace regurgitation No evidence of significant pericardial effusion The aortic root is normal  Normal pulmonary artery IVC is normal with respiratory variation.   Stress Test 06/08/18 Impression  1.  Lexiscan stress test was performed.  Exercise capacity was not assessed.  Stress symptoms included dizziness, stomach pain and chest pressure.  Peak effect blood pressure was 164/90 mmHg.  The resting and stress electrocardiogram demonstrated normal sinus rhythm, normal resting conduction, no resting arrhythmias and normal rest repolarization.  Stress EKG is non diagnostic for ischemia as it is a pharmacologic stress.   2.  The overall quality of the study is fair.  Left ventricular cavity is noted to be normal on the rest and stress studies.  Gated SPECT images reveal normal myocarial thickening and wall motion.  The left ventricular ejection fraction was calculated 35%, although visually appears normal.  Small sized, severe intensity perfusion defect in anteroseptal myocardium seen only on rest images likely represents tissue attenuation artifact.   3. High risk stress test due to reduced LVEF.  Clinical correlation recommended.  Past Medical History:  Diagnosis Date  . Arthritis   . Environmental allergies   . External hemorrhoid   . Heart murmur    asymptomatic  . Hypertension     Past Surgical History:  Procedure Laterality Date  . APPENDECTOMY  08-20-2009  . DX LAPAROSCOPY /  LYSIS ADHESIONS/  OPEN ILEOCECAL RESECTION  08-28-2009   cecal perforation  . EVALUATION UNDER ANESTHESIA WITH HEMORRHOIDECTOMY N/A 03/12/2015  Procedure: EXAM UNDER ANESTHESIA WITH HEMORRHOIDECTOMY;  Surgeon: Axel FillerArmando Ramirez, MD;  [facility];  Service: General;  Laterality: N/A;  . KNEE ARTHROSCOPY W/ MENISCECTOMY Right 07-10-2006  . TOTAL KNEE ARTHROPLASTY Right 08/06/2018   Procedure: TOTAL KNEE ARTHROPLASTY;   Surgeon: Ollen GrossAluisio, Frank, MD;  Location: [facility];  Service: Orthopedics;  Laterality: Right;    MEDICATIONS: No current facility-administered medications for this encounter.    Marland Kitchen. acetaminophen (TYLENOL) 500 MG tablet  . amLODipine (NORVASC) 10 MG tablet  . docusate sodium (COLACE) 50 MG capsule  . gabapentin (NEURONTIN) 300 MG capsule  . magnesium hydroxide (MILK OF MAGNESIA) 400 MG/5ML suspension  . methocarbamol (ROBAXIN) 500 MG tablet  . Multiple Vitamins-Minerals (EMERGEN-C IMMUNE PO)  . oxyCODONE (OXY IR/ROXICODONE) 5 MG immediate release tablet  . sildenafil (VIAGRA) 100 MG tablet  . traMADol (ULTRAM) 50 MG tablet  . triamterene-hydrochlorothiazide (MAXZIDE-25) 37.5-25 MG tablet    Janey GentaJessica Nolawi Kanady, PA-C [facility] [phone] 09/20/18 2:25 PM

## 2018-09-20 NOTE — Anesthesia Preprocedure Evaluation (Addendum)
Anesthesia Evaluation  Patient identified by MRN, date of birth, ID band Patient awake    Reviewed: Allergy & Precautions, NPO status , Patient's Chart, lab work & pertinent test results  Airway Mallampati: II  TM Distance: >3 FB Neck ROM: Full    Dental  (+) Teeth Intact, Dental Advisory Given   Pulmonary neg pulmonary ROS,    breath sounds clear to auscultation       Cardiovascular hypertension, Pt. on medications  Rhythm:Regular Rate:Normal     Neuro/Psych negative neurological ROS     GI/Hepatic negative GI ROS, Neg liver ROS,   Endo/Other  negative endocrine ROS  Renal/GU negative Renal ROS     Musculoskeletal  (+) Arthritis ,   Abdominal Normal abdominal exam  (+)   Peds  Hematology negative hematology ROS (+)   Anesthesia Other Findings   Reproductive/Obstetrics                           Anesthesia Physical Anesthesia Plan  ASA: II  Anesthesia Plan: General   Post-op Pain Management:    Induction: Intravenous  PONV Risk Score and Plan: 3 and Ondansetron, Propofol infusion and Midazolam  Airway Management Planned: Mask  Additional Equipment: None  Intra-op Plan:   Post-operative Plan:   Informed Consent: I have reviewed the patients History and Physical, chart, labs and discussed the procedure including the risks, benefits and alternatives for the proposed anesthesia with the patient or authorized representative who has indicated his/her understanding and acceptance.       Plan Discussed with: CRNA  Anesthesia Plan Comments: (See PAT note 09/20/18, Jodell Cipro, PA-C)      Anesthesia Quick Evaluation

## 2018-09-23 NOTE — H&P (Signed)
CC- Casimiro Blum is a 58 y.o. male who presents with right knee stiffness.  HPI- . Knee Pain: Patient presents with stiffness involving the  right knee. Onset of the symptoms was several weeks ago. Inciting event: He had a right Total Knee Arthroplasty on 08/06/18 and has developed significant stiffness which has not responded to physical therapy. He has had adequate PT and is not progressing with respect to his flexion. He has approximately 90 degrees of flexion and desires more range of motion. He presents for closed manipulation.   Past Medical History:  Diagnosis Date  . Arthritis   . Environmental allergies   . External hemorrhoid   . Heart murmur    asymptomatic  . Hypertension     Past Surgical History:  Procedure Laterality Date  . APPENDECTOMY  08-20-2009  . DX LAPAROSCOPY /  LYSIS ADHESIONS/  OPEN ILEOCECAL RESECTION  08-28-2009   cecal perforation  . EVALUATION UNDER ANESTHESIA WITH HEMORRHOIDECTOMY N/A 03/12/2015   Procedure: EXAM UNDER ANESTHESIA WITH HEMORRHOIDECTOMY;  Surgeon: Axel Filler, MD;  Location: [facility];  Service: General;  Laterality: N/A;  . KNEE ARTHROSCOPY W/ MENISCECTOMY Right 07-10-2006  . TOTAL KNEE ARTHROPLASTY Right 08/06/2018   Procedure: TOTAL KNEE ARTHROPLASTY;  Surgeon: Ollen Gross, MD;  Location: [facility];  Service: Orthopedics;  Laterality: Right;    Prior to Admission medications   Medication Sig Start Date End Date Taking? Authorizing Provider  acetaminophen (TYLENOL) 500 MG tablet Take 1,000 mg by mouth every 6 (six) hours as needed for moderate pain or headache.    Yes [provider]  amLODipine (NORVASC) 10 MG tablet Take 10 mg by mouth daily. 06/28/18  Yes [provider]  docusate sodium (COLACE) 50 MG capsule Take 50-100 mg by mouth 2 (two) times daily as needed for mild constipation.   Yes [provider]  gabapentin (NEURONTIN) 300 MG capsule Take 1 capsule (300 mg total) by mouth 3  (three) times daily. Take a 300 mg capsule three times a day for two weeks following surgery.Then take a 300 mg capsule two times a day for two weeks. Then take a 300 mg capsule once a day for two weeks. Then discontinue. Patient taking differently: Take 300 mg by mouth daily.  08/07/18  Yes Edmisten, Kristie L, PA  magnesium hydroxide (MILK OF MAGNESIA) 400 MG/5ML suspension Take 15 mLs by mouth daily as needed for mild constipation.   Yes [provider]  methocarbamol (ROBAXIN) 500 MG tablet Take 1 tablet (500 mg total) by mouth every 6 (six) hours as needed for muscle spasms. 08/07/18  Yes Edmisten, Lyn Hollingshead, PA  Multiple Vitamins-Minerals (EMERGEN-C IMMUNE PO) Take 1 packet by mouth daily as needed (immune support).   Yes [provider]  oxyCODONE (OXY IR/ROXICODONE) 5 MG immediate release tablet Take 1-2 tablets (5-10 mg total) by mouth every 6 (six) hours as needed for severe pain. Patient taking differently: Take 5 mg by mouth every 6 (six) hours as needed for severe pain.  08/07/18  Yes Edmisten, Kristie L, PA  sildenafil (VIAGRA) 100 MG tablet Take 50 mg by mouth daily as needed for erectile dysfunction.   Yes [provider]  traMADol (ULTRAM) 50 MG tablet Take 1-2 tablets (50-100 mg total) by mouth every 6 (six) hours as needed for moderate pain. Patient taking differently: Take 50 mg by mouth every 6 (six) hours as needed for moderate pain.  08/07/18  Yes Edmisten, Lyn Hollingshead, PA  triamterene-hydrochlorothiazide (MAXZIDE-25) 37.5-25  MG tablet TAKE 1 (ONE) TABLET DAILY IN THE MORNING 09/18/18   Earl ManyVyas, Chandra K, MD   RIGHT KNEE EXAM antalgic gait,  No warmth or effusion, reduced range of motion (5-90 degrees), collateral ligaments intact  Physical Examination: General appearance - alert, well appearing, and in no distress Mental status - alert, oriented to person, place, and time Chest - clear to auscultation, no wheezes, rales or rhonchi, symmetric air entry Heart  - normal rate, regular rhythm, normal S1, S2, no murmurs, rubs, clicks or gallops Abdomen - soft, nontender, nondistended, no masses or organomegaly Neurological - alert, oriented, normal speech, no focal findings or movement disorder noted   Asessment/Plan---Right knee arthrofibrosis- - Plan right knee closed manipulation. Procedure risks and potential comps discussed with patient who elects to proceed. Goals are decreased pain and increased function with a high likelihood of achieving both

## 2018-09-24 ENCOUNTER — Other Ambulatory Visit: Payer: Self-pay

## 2018-09-24 ENCOUNTER — Ambulatory Visit (HOSPITAL_COMMUNITY)
Admission: RE | Admit: 2018-09-24 | Discharge: 2018-09-24 | Disposition: A | Discharge status: Home / Self Care | Payer: BLUE CROSS/BLUE SHIELD | Attending: Orthopedic Surgery | Admitting: Orthopedic Surgery

## 2018-09-24 ENCOUNTER — Encounter (HOSPITAL_COMMUNITY)
Admission: RE | Disposition: A | Discharge status: Home / Self Care | Payer: Self-pay | Source: Home / Self Care | Attending: Orthopedic Surgery

## 2018-09-24 ENCOUNTER — Ambulatory Visit (HOSPITAL_COMMUNITY): Payer: BLUE CROSS/BLUE SHIELD | Admitting: Physician Assistant

## 2018-09-24 ENCOUNTER — Encounter (HOSPITAL_COMMUNITY): Payer: Self-pay | Admitting: *Deleted

## 2018-09-24 DIAGNOSIS — M1990 Unspecified osteoarthritis, unspecified site: Secondary | ICD-10-CM | POA: Insufficient documentation

## 2018-09-24 DIAGNOSIS — I10 Essential (primary) hypertension: Secondary | ICD-10-CM | POA: Insufficient documentation

## 2018-09-24 DIAGNOSIS — M24661 Ankylosis, right knee: Secondary | ICD-10-CM | POA: Diagnosis present

## 2018-09-24 DIAGNOSIS — Z96651 Presence of right artificial knee joint: Secondary | ICD-10-CM | POA: Diagnosis not present

## 2018-09-24 DIAGNOSIS — T8482XA Fibrosis due to internal orthopedic prosthetic devices, implants and grafts, initial encounter: Secondary | ICD-10-CM | POA: Diagnosis present

## 2018-09-24 DIAGNOSIS — Z79899 Other long term (current) drug therapy: Secondary | ICD-10-CM | POA: Insufficient documentation

## 2018-09-24 HISTORY — PX: KNEE CLOSED REDUCTION: SHX995

## 2018-09-24 LAB — CBC WITH DIFFERENTIAL/PLATELET
Abs Immature Granulocytes: 0.01 10*3/uL (ref 0.00–0.07)
Basophils Absolute: 0 10*3/uL (ref 0.0–0.1)
Basophils Relative: 1 %
Eosinophils Absolute: 0.1 10*3/uL (ref 0.0–0.5)
Eosinophils Relative: 4 %
HCT: 42.6 % (ref 39.0–52.0)
Hemoglobin: 13.6 g/dL (ref 13.0–17.0)
Immature Granulocytes: 0 %
Lymphocytes Relative: 45 %
Lymphs Abs: 1.4 10*3/uL (ref 0.7–4.0)
MCH: 28.2 pg (ref 26.0–34.0)
MCHC: 31.9 g/dL (ref 30.0–36.0)
MCV: 88.2 fL (ref 80.0–100.0)
Monocytes Absolute: 0.3 10*3/uL (ref 0.1–1.0)
Monocytes Relative: 9 %
Neutro Abs: 1.3 10*3/uL — ABNORMAL LOW (ref 1.7–7.7)
Neutrophils Relative %: 41 %
Platelets: 242 10*3/uL (ref 150–400)
RBC: 4.83 MIL/uL (ref 4.22–5.81)
RDW: 13.8 % (ref 11.5–15.5)
WBC: 3.1 10*3/uL — ABNORMAL LOW (ref 4.0–10.5)
nRBC: 0 % (ref 0.0–0.2)

## 2018-09-24 LAB — COMPREHENSIVE METABOLIC PANEL
ALT: 45 U/L — ABNORMAL HIGH (ref 0–44)
AST: 22 U/L (ref 15–41)
Albumin: 4.7 g/dL (ref 3.5–5.0)
Alkaline Phosphatase: 64 U/L (ref 38–126)
Anion gap: 11 (ref 5–15)
BUN: 23 mg/dL — ABNORMAL HIGH (ref 6–20)
CO2: 25 mmol/L (ref 22–32)
Calcium: 9.7 mg/dL (ref 8.9–10.3)
Chloride: 105 mmol/L (ref 98–111)
Creatinine, Ser: 0.86 mg/dL (ref 0.61–1.24)
GFR calc Af Amer: >60 mL/min (ref >60)
GFR calc non Af Amer: >60 mL/min (ref >60)
Glucose, Bld: 98 mg/dL (ref 70–99)
Potassium: 3.5 mmol/L (ref 3.5–5.1)
Sodium: 141 mmol/L (ref 135–145)
Total Bilirubin: 1 mg/dL (ref 0.3–1.2)
Total Protein: 7.9 g/dL (ref 6.5–8.1)

## 2018-09-24 SURGERY — MANIPULATION, KNEE, CLOSED
Anesthesia: General | Site: Knee | Laterality: Right

## 2018-09-24 MED ORDER — ACETAMINOPHEN 325 MG PO TABS
325.0000 mg | ORAL_TABLET | Freq: Once | ORAL | Status: AC
  Administered 2018-09-24: 650 mg via ORAL

## 2018-09-24 MED ORDER — MIDAZOLAM HCL 2 MG/2ML IJ SOLN
INTRAMUSCULAR | Status: DC | PRN
  Administered 2018-09-24: 2 mg via INTRAVENOUS

## 2018-09-24 MED ORDER — HYDROMORPHONE HCL 1 MG/ML IJ SOLN
INTRAMUSCULAR | Status: AC
  Filled 2018-09-24: qty 1

## 2018-09-24 MED ORDER — PROMETHAZINE HCL 25 MG/ML IJ SOLN: 6.2500 mg | INTRAMUSCULAR | Status: DC | PRN

## 2018-09-24 MED ORDER — LIDOCAINE 2% (20 MG/ML) 5 ML SYRINGE
INTRAMUSCULAR | Status: AC
  Filled 2018-09-24: qty 5

## 2018-09-24 MED ORDER — LACTATED RINGERS IV SOLN
INTRAVENOUS | Status: DC
  Administered 2018-09-24: 10:00:00 via INTRAVENOUS

## 2018-09-24 MED ORDER — LACTATED RINGERS IV SOLN
INTRAVENOUS | Status: DC
  Administered 2018-09-24: 09:00:00 via INTRAVENOUS

## 2018-09-24 MED ORDER — ACETAMINOPHEN 160 MG/5ML PO SOLN
325.0000 mg | Freq: Once | ORAL | Status: AC
  Filled 2018-09-24: qty 20.3

## 2018-09-24 MED ORDER — PROPOFOL 10 MG/ML IV BOLUS
INTRAVENOUS | Status: DC | PRN
  Administered 2018-09-24 (×2): 100 mg via INTRAVENOUS

## 2018-09-24 MED ORDER — FENTANYL CITRATE (PF) 100 MCG/2ML IJ SOLN
INTRAMUSCULAR | Status: AC
  Filled 2018-09-24: qty 2

## 2018-09-24 MED ORDER — ACETAMINOPHEN 325 MG PO TABS
ORAL_TABLET | ORAL | Status: AC
  Filled 2018-09-24: qty 2

## 2018-09-24 MED ORDER — MIDAZOLAM HCL 2 MG/2ML IJ SOLN
INTRAMUSCULAR | Status: AC
  Filled 2018-09-24: qty 2

## 2018-09-24 MED ORDER — MEPERIDINE HCL 50 MG/ML IJ SOLN: 6.2500 mg | INTRAMUSCULAR | Status: DC | PRN

## 2018-09-24 MED ORDER — ONDANSETRON HCL 4 MG/2ML IJ SOLN
INTRAMUSCULAR | Status: DC | PRN
  Administered 2018-09-24: 4 mg via INTRAVENOUS

## 2018-09-24 MED ORDER — PROPOFOL 10 MG/ML IV BOLUS
INTRAVENOUS | Status: AC
  Filled 2018-09-24: qty 40

## 2018-09-24 MED ORDER — POVIDONE-IODINE 10 % EX SWAB
2.0000 "application " | Freq: Once | CUTANEOUS | Status: AC
  Administered 2018-09-24: 2 via TOPICAL

## 2018-09-24 MED ORDER — FENTANYL CITRATE (PF) 250 MCG/5ML IJ SOLN
INTRAMUSCULAR | Status: DC | PRN
  Administered 2018-09-24: 50 ug via INTRAVENOUS

## 2018-09-24 MED ORDER — LIDOCAINE 2% (20 MG/ML) 5 ML SYRINGE
INTRAMUSCULAR | Status: DC | PRN
  Administered 2018-09-24: 60 mg via INTRAVENOUS

## 2018-09-24 MED ORDER — ACETAMINOPHEN 10 MG/ML IV SOLN: 1000.0000 mg | Freq: Once | INTRAVENOUS | Status: DC | PRN

## 2018-09-24 MED ORDER — SODIUM CHLORIDE 0.9 % IV SOLN: INTRAVENOUS | Status: DC

## 2018-09-24 MED ORDER — HYDROMORPHONE HCL 1 MG/ML IJ SOLN
0.2500 mg | INTRAMUSCULAR | Status: DC | PRN
  Administered 2018-09-24 (×3): 0.5 mg via INTRAVENOUS

## 2018-09-24 SURGICAL SUPPLY — 16 items
BANDAGE ADH SHEER 1  50/CT (GAUZE/BANDAGES/DRESSINGS) IMPLANT
COVER SURGICAL LIGHT HANDLE (MISCELLANEOUS) ×1 IMPLANT
COVER WAND RF STERILE (DRAPES) IMPLANT
GAUZE SPONGE 4X4 12PLY STRL (GAUZE/BANDAGES/DRESSINGS) IMPLANT
GLOVE BIO SURGEON STRL SZ8 (GLOVE) ×1 IMPLANT
GLOVE BIOGEL PI IND STRL 7.0 (GLOVE) ×1 IMPLANT
GLOVE BIOGEL PI IND STRL 8 (GLOVE) ×1 IMPLANT
GLOVE BIOGEL PI INDICATOR 7.0 (GLOVE)
GLOVE BIOGEL PI INDICATOR 8 (GLOVE)
GLOVE SURG SS PI 7.0 STRL IVOR (GLOVE) ×1 IMPLANT
GOWN STRL REUS W/TWL LRG LVL3 (GOWN DISPOSABLE) ×1 IMPLANT
KIT TURNOVER KIT A (KITS) IMPLANT
NDL SAFETY ECLIPSE 18X1.5 (NEEDLE) IMPLANT
NEEDLE HYPO 18GX1.5 SHARP (NEEDLE)
SWABSTICK PVP IODINE (MISCELLANEOUS) ×3 IMPLANT
SYR CONTROL 10ML LL (SYRINGE) IMPLANT

## 2018-09-24 NOTE — Addendum Note (Signed)
Addendum  created 09/24/18 1135 by Nelle Don, CRNA   Attestation recorded in [place], Intraprocedure Attestations filed

## 2018-09-24 NOTE — Transfer of Care (Signed)
Immediate Anesthesia Transfer of Care Note  Patient: [name] [name]  Procedure(s) Performed: CLOSED MANIPULATION KNEE (Right Knee)  Patient Location: PACU  Anesthesia Type:General  Level of Consciousness: sedated and drowsy  Airway & Oxygen Therapy: Patient Spontanous Breathing and Patient connected to face mask oxygen  Post-op Assessment: Report given to RN and Post -op Vital signs reviewed and stable  Post vital signs: Reviewed and stable  Last Vitals:  Vitals Value Taken Time  BP 129/89 09/24/2018 10:10 AM  Temp    Pulse 88 09/24/2018 10:12 AM  Resp 18 09/24/2018 10:12 AM  SpO2 100 % 09/24/2018 10:12 AM  Vitals shown include unvalidated device data.  Last Pain:  Vitals:   09/24/18 0813  TempSrc: Oral         Complications: No apparent anesthesia complications

## 2018-09-24 NOTE — Anesthesia Postprocedure Evaluation (Signed)
Anesthesia Post Note  Patient: Kaius Salber  Procedure(s) Performed: CLOSED MANIPULATION KNEE (Right Knee)     Patient location during evaluation: PACU Anesthesia Type: General Level of consciousness: awake and alert Pain management: pain level controlled Vital Signs Assessment: post-procedure vital signs reviewed and stable Respiratory status: spontaneous breathing, nonlabored ventilation, respiratory function stable and patient connected to nasal cannula oxygen Cardiovascular status: blood pressure returned to baseline and stable Postop Assessment: no apparent nausea or vomiting Anesthetic complications: no    Last Vitals:  Vitals:   09/24/18 1045 09/24/18 1100  BP: (!) 123/92 140/88  Pulse: 89 89  Resp: 13 13  Temp: 36.6 C 36.4 C  SpO2: 100% 99%    Last Pain:  Vitals:   09/24/18 1100  TempSrc:   PainSc: 4                  Shelton Silvas

## 2018-09-24 NOTE — Anesthesia Procedure Notes (Signed)
Procedure Name: MAC Date/Time: 09/24/2018 9:50 AM Performed by: Eben Burow, CRNA Pre-anesthesia Checklist: Patient identified, Emergency Drugs available, Suction available, Patient being monitored and Timeout performed Patient Re-evaluated:Patient Re-evaluated prior to induction Oxygen Delivery Method: Circle system utilized Preoxygenation: Pre-oxygenation with 100% oxygen Induction Type: IV induction Ventilation: Mask ventilation without difficulty Dental Injury: Teeth and Oropharynx as per pre-operative assessment

## 2018-09-24 NOTE — Interval H&P Note (Signed)
History and Physical Interval Note:  09/24/2018 9:19 AM  [name] [name]  has presented today for surgery, with the diagnosis of right knee arthrofibrosis.  The various methods of treatment have been discussed with the patient and family. After consideration of risks, benefits and other options for treatment, the patient has consented to  Procedure(s) with comments: CLOSED MANIPULATION KNEE (Right) - Negative to COVID19 screening as a surgical intervention.  The patient's history has been reviewed, patient examined, no change in status, stable for surgery.  I have reviewed the patient's chart and labs.  Questions were answered to the patient's satisfaction.     Homero Fellers Elvina Bosch

## 2018-09-24 NOTE — Discharge Instructions (Signed)
Resume physical therapy as soon as possible  Do activities as tolerated

## 2018-09-24 NOTE — Op Note (Signed)
  OPERATIVE REPORT   PREOPERATIVE DIAGNOSIS: Arthrofibrosis, Right  knee.   POSTOPERATIVE DIAGNOSIS: Arthrofibrosis, Right knee.   PROCEDURE:  Right  knee closed manipulation.   SURGEON: Olie Dibert, MD   ASSISTANT: None.   ANESTHESIA: General.   COMPLICATIONS: None.   CONDITION: Stable to Recovery.   Pre-manipulation range of motion is 5-90.  Post-manipulation range of  Motion is 0-130  PROCEDURE IN DETAIL: After successful administration of general  anesthetic, exam under anesthesia was performed showing range of motion  5-90 degrees. I then placed my chest against the proximal tibia,  flexing the knee with audible lysis of adhesions. I was easily able to  get the knee flexed to 130  degrees. I then put the knee back in extension and with some  patellar manipulation and gentle pressure got to full  Extension.The patient was subsequently awakened and transported to Recovery in  stable condition.    

## 2018-09-25 ENCOUNTER — Encounter (HOSPITAL_COMMUNITY): Payer: Self-pay | Admitting: Orthopedic Surgery

## 2018-10-16 ENCOUNTER — Other Ambulatory Visit: Payer: Self-pay

## 2018-10-16 MED ORDER — SILDENAFIL CITRATE 100 MG PO TABS: 50.0000 mg | ORAL_TABLET | Freq: Every day | ORAL | 1 refills | Status: AC | PRN

## 2018-12-22 ENCOUNTER — Other Ambulatory Visit: Payer: Self-pay | Admitting: Cardiology

## 2018-12-22 DIAGNOSIS — I10 Essential (primary) hypertension: Secondary | ICD-10-CM

## 2018-12-27 ENCOUNTER — Inpatient Hospital Stay: Payer: BLUE CROSS/BLUE SHIELD

## 2018-12-27 ENCOUNTER — Other Ambulatory Visit: Payer: Self-pay | Admitting: Hematology

## 2018-12-27 ENCOUNTER — Inpatient Hospital Stay: Payer: BLUE CROSS/BLUE SHIELD | Attending: Hematology | Admitting: Hematology

## 2018-12-27 DIAGNOSIS — D709 Neutropenia, unspecified: Secondary | ICD-10-CM

## 2019-03-24 ENCOUNTER — Other Ambulatory Visit: Payer: Self-pay | Admitting: Cardiology

## 2019-03-24 DIAGNOSIS — I10 Essential (primary) hypertension: Secondary | ICD-10-CM

## 2019-05-24 ENCOUNTER — Other Ambulatory Visit: Payer: Self-pay

## 2019-05-24 DIAGNOSIS — Z20822 Contact with and (suspected) exposure to covid-19: Secondary | ICD-10-CM

## 2019-05-25 LAB — NOVEL CORONAVIRUS, NAA: SARS-CoV-2, NAA: NOT DETECTED

## 2019-06-12 ENCOUNTER — Other Ambulatory Visit: Payer: Self-pay | Admitting: Cardiology

## 2019-06-12 DIAGNOSIS — I10 Essential (primary) hypertension: Secondary | ICD-10-CM

## 2019-07-01 ENCOUNTER — Other Ambulatory Visit: Payer: Self-pay | Admitting: Cardiology

## 2019-07-01 NOTE — Telephone Encounter (Signed)
Pt has appt with you tomorrow. Last seen by [facility] 06/2018.

## 2019-07-02 ENCOUNTER — Other Ambulatory Visit: Payer: Self-pay

## 2019-07-02 ENCOUNTER — Ambulatory Visit: Payer: Self-pay | Admitting: Cardiology

## 2019-07-08 NOTE — Telephone Encounter (Signed)
Needs refills from PCP

## 2019-09-03 ENCOUNTER — Other Ambulatory Visit: Payer: Self-pay | Admitting: Cardiology

## 2019-09-03 DIAGNOSIS — I10 Essential (primary) hypertension: Secondary | ICD-10-CM

## 2019-11-13 ENCOUNTER — Other Ambulatory Visit: Payer: Self-pay | Admitting: Orthopedic Surgery

## 2019-11-13 NOTE — Progress Notes (Addendum)
PCP - Lindaann Pascal PA-C Cardiologist - Dr. Sherril Croon  PPM/ICD -  Device Orders -  Rep Notified -   Chest x-ray -  EKG - 07-2018 on chart  10-16-19 on chart Stress Test -  07-2018 on chart ECHO - 2-18-20on chart Cardiac Cath -   Sleep Study -  CPAP -   Fasting Blood Sugar -  Checks Blood Sugar _____ times a day  Blood Thinner Instructions: Aspirin Instructions:  ERAS Protcol - PRE-SURGERY Ensure   COVID TEST- 6-3   Anesthesia review:   Patient denies shortness of breath, fever, cough and chest pain at PAT appointment   none   All instructions explained to the patient, with a verbal understanding of the material. Patient agrees to go over the instructions while at home for a better understanding. Patient also instructed to self quarantine after being tested for COVID-19. The opportunity to ask questions was provided.

## 2019-11-13 NOTE — Patient Instructions (Addendum)
DUE TO COVID-19 ONLY ONE VISITOR IS ALLOWED TO COME WITH YOU AND STAY IN THE WAITING ROOM ONLY DURING PRE OP AND PROCEDURE DAY OF SURGERY. TWO VISITOR MAY VISIT WITH YOU AFTER SURGERY IN YOUR PRIVATE ROOM DURING VISITING HOURS ONLY! 10-8pm  YOU NEED TO HAVE A COVID 19 TEST ON_6-3-21______ @__done_____ , THIS TEST MUST BE DONE BEFORE SURGERY, COME  [address], [place] , .  [facility]) ONCE YOUR COVID TEST IS COMPLETED, PLEASE BEGIN THE QUARANTINE INSTRUCTIONS AS OUTLINED IN YOUR HANDOUT.                [name] [name]  11/13/2019   Your procedure is scheduled on:    11-18-19    Report to [facility] Main  Entrance   Report to admitting at      0700 AM     Call this number if you have problems the morning of surgery [phone]    Remember: NO SOLID FOOD AFTER MIDNIGHT THE NIGHT PRIOR TO SURGERY. NOTHING BY MOUTH EXCEPT CLEAR LIQUIDS UNTIL   0700 am . PLEASE FINISH ENSURE DRINK PER SURGEON ORDER  WHICH NEEDS TO BE COMPLETED AT   0700 am then nothing by mouth    CLEAR LIQUID DIET   Foods Allowed                                                                                  Foods Excluded  Coffee and tea, regular and decaf    No creamer                         liquids that you cannot  Plain Jell-O any favor except red or purple                                           see through such as: Fruit ices (not with fruit pulp)                                                                milk, soups, orange juice  Iced Popsicles                                                                   All solid food Carbonated beverages, regular and diet                                    Cranberry, grape and apple juices Sports drinks like Gatorade Lightly seasoned clear broth or consume(fat free) Sugar,  honey syrup  _____________________________________________________________________     BRUSH YOUR TEETH MORNING OF SURGERY AND RINSE YOUR MOUTH  OUT, NO CHEWING GUM CANDY OR MINTS.     Take these medicines the morning of surgery with A SIP OF WATER: amlodipine                                 You may not have any metal on your body including hair pins and              piercings  Do not wear jewelry,  lotions, powders or perfumes, deodorant                     Men may shave face and neck.   Do not bring valuables to the hospital. [place].  Contacts, dentures or bridgework may not be worn into surgery.      Patients discharged the day of surgery will not be allowed to drive home. IF YOU ARE HAVING SURGERY AND GOING HOME THE SAME DAY, YOU MUST HAVE AN ADULT TO DRIVE YOU HOME AND BE WITH YOU FOR 24 HOURS. YOU MAY GO HOME BY TAXI OR UBER OR ORTHERWISE, BUT AN ADULT MUST ACCOMPANY YOU HOME AND STAY WITH YOU FOR 24 HOURS.  Name and phone number of your driver:  Special Instructions: N/A              Please read over the following fact sheets you were given: _____________________________________________________________________             [facility] - Preparing for Surgery Before surgery, you can play an important role.  Because skin is not sterile, your skin needs to be as free of germs as possible.  You can reduce the number of germs on your skin by washing with CHG (chlorahexidine gluconate) soap before surgery.  CHG is an antiseptic cleaner which kills germs and bonds with the skin to continue killing germs even after washing. Please DO NOT use if you have an allergy to CHG or antibacterial soaps.  If your skin becomes reddened/irritated stop using the CHG and inform your nurse when you arrive at Short Stay. Do not shave (including legs and underarms) for at least 48 hours prior to the first CHG shower.  You may shave your face/neck. Please follow these instructions carefully:  1.  Shower with CHG Soap the night before surgery and the  morning of Surgery.  2.  If you choose to  wash your hair, wash your hair first as usual with your  normal  shampoo.  3.  After you shampoo, rinse your hair and body thoroughly to remove the  shampoo.                           4.  Use CHG as you would any other liquid soap.  You can apply chg directly  to the skin and wash                       Gently with a scrungie or clean washcloth.  5.  Apply the CHG Soap to your body ONLY FROM THE NECK DOWN.   Do not use on face/ open  Wound or open sores. Avoid contact with eyes, ears mouth and genitals (private parts).                       Wash face,  Genitals (private parts) with your normal soap.             6.  Wash thoroughly, paying special attention to the area where your surgery  will be performed.  7.  Thoroughly rinse your body with warm water from the neck down.  8.  DO NOT shower/wash with your normal soap after using and rinsing off  the CHG Soap.                9.  Pat yourself dry with a clean towel.            10.  Wear clean pajamas.            11.  Place clean sheets on your bed the night of your first shower and do not  sleep with pets. Day of Surgery : Do not apply any lotions/deodorants the morning of surgery.  Please wear clean clothes to the hospital/surgery center.  FAILURE TO FOLLOW THESE INSTRUCTIONS MAY RESULT IN THE CANCELLATION OF YOUR SURGERY PATIENT SIGNATURE_________________________________  NURSE SIGNATURE__________________________________  ________________________________________________________________________   Adam Phenix  An incentive spirometer is a tool that can help keep your lungs clear and active. This tool measures how well you are filling your lungs with each breath. Taking long deep breaths may help reverse or decrease the chance of developing breathing (pulmonary) problems (especially infection) following:  A long period of time when you are unable to move or be active. BEFORE THE PROCEDURE   If the  spirometer includes an indicator to show your best effort, your nurse or respiratory therapist will set it to a desired goal.  If possible, sit up straight or lean slightly forward. Try not to slouch.  Hold the incentive spirometer in an upright position. INSTRUCTIONS FOR USE  1. Sit on the edge of your bed if possible, or sit up as far as you can in bed or on a chair. 2. Hold the incentive spirometer in an upright position. 3. Breathe out normally. 4. Place the mouthpiece in your mouth and seal your lips tightly around it. 5. Breathe in slowly and as deeply as possible, raising the piston or the ball toward the top of the column. 6. Hold your breath for 3-5 seconds or for as long as possible. Allow the piston or ball to fall to the bottom of the column. 7. Remove the mouthpiece from your mouth and breathe out normally. 8. Rest for a few seconds and repeat Steps 1 through 7 at least 10 times every 1-2 hours when you are awake. Take your time and take a few normal breaths between deep breaths. 9. The spirometer may include an indicator to show your best effort. Use the indicator as a goal to work toward during each repetition. 10. After each set of 10 deep breaths, practice coughing to be sure your lungs are clear. If you have an incision (the cut made at the time of surgery), support your incision when coughing by placing a pillow or rolled up towels firmly against it. Once you are able to get out of bed, walk around indoors and cough well. You may stop using the incentive spirometer when instructed by your caregiver.  RISKS AND COMPLICATIONS  Take your time so you do not get  dizzy or light-headed.  If you are in pain, you may need to take or ask for pain medication before doing incentive spirometry. It is harder to take a deep breath if you are having pain. AFTER USE  Rest and breathe slowly and easily.  It can be helpful to keep track of a log of your progress. Your caregiver can provide  you with a simple table to help with this. If you are using the spirometer at home, follow these instructions: [place] IF:   You are having difficultly using the spirometer.  You have trouble using the spirometer as often as instructed.  Your pain medication is not giving enough relief while using the spirometer.  You develop fever of 100.5 F (38.1 C) or higher. SEEK IMMEDIATE MEDICAL CARE IF:   You cough up bloody sputum that had not been present before.  You develop fever of 102 F (38.9 C) or greater.  You develop worsening pain at or near the incision site. MAKE SURE YOU:   Understand these instructions.  Will watch your condition.  Will get help right away if you are not doing well or get worse. Document Released: 10/10/2006 Document Revised: 08/22/2011 Document Reviewed: 12/11/2006 [facility] Patient Information 2014 [place], [place].   ________________________________________________________________________

## 2019-11-14 ENCOUNTER — Encounter (HOSPITAL_COMMUNITY): Payer: Self-pay

## 2019-11-14 ENCOUNTER — Other Ambulatory Visit (HOSPITAL_COMMUNITY)
Admission: RE | Admit: 2019-11-14 | Discharge: 2019-11-14 | Disposition: A | Discharge status: Home / Self Care | Payer: Medicare Other | Source: Ambulatory Visit | Attending: Orthopedic Surgery | Admitting: Orthopedic Surgery

## 2019-11-14 ENCOUNTER — Encounter (HOSPITAL_COMMUNITY)
Admission: RE | Admit: 2019-11-14 | Discharge: 2019-11-14 | Disposition: A | Discharge status: Home / Self Care | Payer: Medicare Other | Source: Ambulatory Visit | Attending: Orthopedic Surgery | Admitting: Orthopedic Surgery

## 2019-11-14 ENCOUNTER — Other Ambulatory Visit: Payer: Self-pay

## 2019-11-14 DIAGNOSIS — Z20822 Contact with and (suspected) exposure to covid-19: Secondary | ICD-10-CM | POA: Diagnosis not present

## 2019-11-14 DIAGNOSIS — Z01812 Encounter for preprocedural laboratory examination: Secondary | ICD-10-CM | POA: Insufficient documentation

## 2019-11-14 LAB — COMPREHENSIVE METABOLIC PANEL
ALT: 42 U/L (ref 0–44)
AST: 38 U/L (ref 15–41)
Albumin: 4.1 g/dL (ref 3.5–5.0)
Alkaline Phosphatase: 39 U/L (ref 38–126)
Anion gap: 8 (ref 5–15)
BUN: 18 mg/dL (ref 6–20)
CO2: 28 mmol/L (ref 22–32)
Calcium: 9.1 mg/dL (ref 8.9–10.3)
Chloride: 104 mmol/L (ref 98–111)
Creatinine, Ser: 1.19 mg/dL (ref 0.61–1.24)
GFR calc Af Amer: >60 mL/min (ref >60)
GFR calc non Af Amer: >60 mL/min (ref >60)
Glucose, Bld: 90 mg/dL (ref 70–99)
Potassium: 3.8 mmol/L (ref 3.5–5.1)
Sodium: 140 mmol/L (ref 135–145)
Total Bilirubin: 0.7 mg/dL (ref 0.3–1.2)
Total Protein: 6.7 g/dL (ref 6.5–8.1)

## 2019-11-14 LAB — CBC WITH DIFFERENTIAL/PLATELET
Abs Immature Granulocytes: 0 10*3/uL (ref 0.00–0.07)
Basophils Absolute: 0 10*3/uL (ref 0.0–0.1)
Basophils Relative: 1 %
Eosinophils Absolute: 0.1 10*3/uL (ref 0.0–0.5)
Eosinophils Relative: 2 %
HCT: 50.3 % (ref 39.0–52.0)
Hemoglobin: 16.1 g/dL (ref 13.0–17.0)
Immature Granulocytes: 0 %
Lymphocytes Relative: 37 %
Lymphs Abs: 1.4 10*3/uL (ref 0.7–4.0)
MCH: 28.6 pg (ref 26.0–34.0)
MCHC: 32 g/dL (ref 30.0–36.0)
MCV: 89.3 fL (ref 80.0–100.0)
Monocytes Absolute: 0.4 10*3/uL (ref 0.1–1.0)
Monocytes Relative: 10 %
Neutro Abs: 1.9 10*3/uL (ref 1.7–7.7)
Neutrophils Relative %: 50 %
Platelets: 242 10*3/uL (ref 150–400)
RBC: 5.63 MIL/uL (ref 4.22–5.81)
RDW: 14.5 % (ref 11.5–15.5)
WBC: 3.8 10*3/uL — ABNORMAL LOW (ref 4.0–10.5)
nRBC: 0 % (ref 0.0–0.2)

## 2019-11-14 LAB — SURGICAL PCR SCREEN
MRSA, PCR: NEGATIVE
Staphylococcus aureus: NEGATIVE

## 2019-11-14 LAB — SARS CORONAVIRUS 2 (TAT 6-24 HRS): SARS Coronavirus 2: NEGATIVE

## 2019-11-17 MED ORDER — BUPIVACAINE LIPOSOME 1.3 % IJ SUSP
20.0000 mL | Freq: Once | INTRAMUSCULAR | Status: DC
  Filled 2019-11-17: qty 20

## 2019-11-18 ENCOUNTER — Encounter (HOSPITAL_COMMUNITY)
Admission: RE | Disposition: A | Discharge status: Home / Self Care | Payer: Self-pay | Source: Home / Self Care | Attending: Orthopedic Surgery

## 2019-11-18 ENCOUNTER — Ambulatory Visit (HOSPITAL_COMMUNITY): Payer: Medicare Other | Admitting: Certified Registered"

## 2019-11-18 ENCOUNTER — Ambulatory Visit (HOSPITAL_COMMUNITY)
Admission: RE | Admit: 2019-11-18 | Discharge: 2019-11-18 | Disposition: A | Discharge status: Home / Self Care | Payer: Medicare Other | Attending: Orthopedic Surgery | Admitting: Orthopedic Surgery

## 2019-11-18 ENCOUNTER — Encounter (HOSPITAL_COMMUNITY): Payer: Self-pay | Admitting: Orthopedic Surgery

## 2019-11-18 DIAGNOSIS — T8484XA Pain due to internal orthopedic prosthetic devices, implants and grafts, initial encounter: Secondary | ICD-10-CM | POA: Insufficient documentation

## 2019-11-18 DIAGNOSIS — M24661 Ankylosis, right knee: Secondary | ICD-10-CM | POA: Insufficient documentation

## 2019-11-18 DIAGNOSIS — Y838 Other surgical procedures as the cause of abnormal reaction of the patient, or of later complication, without mention of misadventure at the time of the procedure: Secondary | ICD-10-CM | POA: Insufficient documentation

## 2019-11-18 DIAGNOSIS — Y792 Prosthetic and other implants, materials and accessory orthopedic devices associated with adverse incidents: Secondary | ICD-10-CM | POA: Insufficient documentation

## 2019-11-18 DIAGNOSIS — Z96651 Presence of right artificial knee joint: Secondary | ICD-10-CM | POA: Insufficient documentation

## 2019-11-18 DIAGNOSIS — M1711 Unilateral primary osteoarthritis, right knee: Secondary | ICD-10-CM | POA: Insufficient documentation

## 2019-11-18 HISTORY — PX: SYNOVECTOMY WITH POLY EXCHANGE: SHX6746

## 2019-11-18 SURGERY — SYNOVECTOMY WITH POLY EXCHANGE
Anesthesia: Spinal | Site: Knee | Laterality: Right

## 2019-11-18 MED ORDER — FENTANYL CITRATE (PF) 100 MCG/2ML IJ SOLN
INTRAMUSCULAR | Status: AC
  Filled 2019-11-18: qty 2

## 2019-11-18 MED ORDER — TRANEXAMIC ACID-NACL 1000-0.7 MG/100ML-% IV SOLN
1000.0000 mg | INTRAVENOUS | Status: AC
  Administered 2019-11-18: 1000 mg via INTRAVENOUS
  Filled 2019-11-18: qty 100

## 2019-11-18 MED ORDER — CHLORHEXIDINE GLUCONATE 0.12 % MT SOLN
15.0000 mL | Freq: Once | OROMUCOSAL | Status: AC
  Administered 2019-11-18: 15 mL via OROMUCOSAL

## 2019-11-18 MED ORDER — MIDAZOLAM HCL 2 MG/2ML IJ SOLN
INTRAMUSCULAR | Status: DC | PRN
  Administered 2019-11-18: 1 mg via INTRAVENOUS

## 2019-11-18 MED ORDER — PHENYLEPHRINE HCL (PRESSORS) 10 MG/ML IV SOLN
INTRAVENOUS | Status: AC
  Filled 2019-11-18: qty 1

## 2019-11-18 MED ORDER — OXYCODONE HCL 5 MG PO TABS: 5.0000 mg | ORAL_TABLET | Freq: Once | ORAL | Status: DC | PRN

## 2019-11-18 MED ORDER — GABAPENTIN 300 MG PO CAPS
300.0000 mg | ORAL_CAPSULE | Freq: Once | ORAL | Status: AC
  Administered 2019-11-18: 300 mg via ORAL
  Filled 2019-11-18: qty 1

## 2019-11-18 MED ORDER — ROPIVACAINE HCL 5 MG/ML IJ SOLN
INTRAMUSCULAR | Status: DC | PRN
Start: 2019-11-18 — End: 2019-11-18
  Administered 2019-11-18 (×2): 5 mL via PERINEURAL

## 2019-11-18 MED ORDER — KETOROLAC TROMETHAMINE 30 MG/ML IJ SOLN: 30.0000 mg | Freq: Once | INTRAMUSCULAR | Status: DC | PRN

## 2019-11-18 MED ORDER — 0.9 % SODIUM CHLORIDE (POUR BTL) OPTIME
TOPICAL | Status: DC | PRN
  Administered 2019-11-18: 1000 mL

## 2019-11-18 MED ORDER — ONDANSETRON HCL 4 MG/2ML IJ SOLN: 4.0000 mg | Freq: Once | INTRAMUSCULAR | Status: DC | PRN

## 2019-11-18 MED ORDER — ACETAMINOPHEN 325 MG PO TABS: 325.0000 mg | ORAL_TABLET | ORAL | Status: DC | PRN

## 2019-11-18 MED ORDER — SODIUM CHLORIDE 0.9 % IR SOLN
Status: DC | PRN
  Administered 2019-11-18: 1000 mL

## 2019-11-18 MED ORDER — SODIUM CHLORIDE (PF) 0.9 % IJ SOLN
INTRAMUSCULAR | Status: AC
  Filled 2019-11-18: qty 20

## 2019-11-18 MED ORDER — CEFAZOLIN SODIUM-DEXTROSE 2-4 GM/100ML-% IV SOLN
2.0000 g | INTRAVENOUS | Status: AC
  Administered 2019-11-18: 2 g via INTRAVENOUS
  Filled 2019-11-18: qty 100

## 2019-11-18 MED ORDER — LIDOCAINE 2% (20 MG/ML) 5 ML SYRINGE
INTRAMUSCULAR | Status: DC | PRN
  Administered 2019-11-18: 60 mg via INTRAVENOUS

## 2019-11-18 MED ORDER — ORAL CARE MOUTH RINSE: 15.0000 mL | Freq: Once | OROMUCOSAL | Status: AC

## 2019-11-18 MED ORDER — BUPIVACAINE HCL (PF) 0.25 % IJ SOLN
INTRAMUSCULAR | Status: AC
  Filled 2019-11-18: qty 30

## 2019-11-18 MED ORDER — CELECOXIB 200 MG PO CAPS
400.0000 mg | ORAL_CAPSULE | Freq: Once | ORAL | Status: AC
  Administered 2019-11-18: 400 mg via ORAL
  Filled 2019-11-18: qty 2

## 2019-11-18 MED ORDER — PROPOFOL 10 MG/ML IV BOLUS
INTRAVENOUS | Status: DC | PRN
  Administered 2019-11-18: 50 mg via INTRAVENOUS

## 2019-11-18 MED ORDER — MEPERIDINE HCL 50 MG/ML IJ SOLN: 6.2500 mg | INTRAMUSCULAR | Status: DC | PRN

## 2019-11-18 MED ORDER — CLONIDINE HCL (ANALGESIA) 100 MCG/ML EP SOLN
EPIDURAL | Status: DC | PRN
  Administered 2019-11-18: 100 ug

## 2019-11-18 MED ORDER — MEPIVACAINE HCL (PF) 2 % IJ SOLN
INTRAMUSCULAR | Status: DC | PRN
Start: 2019-11-18 — End: 2019-11-18
  Administered 2019-11-18: 3 mL via EPIDURAL

## 2019-11-18 MED ORDER — ACETAMINOPHEN 160 MG/5ML PO SOLN: 325.0000 mg | ORAL | Status: DC | PRN

## 2019-11-18 MED ORDER — POVIDONE-IODINE 10 % EX SWAB
2.0000 "application " | Freq: Once | CUTANEOUS | Status: AC
  Administered 2019-11-18: 2 via TOPICAL

## 2019-11-18 MED ORDER — FENTANYL CITRATE (PF) 100 MCG/2ML IJ SOLN
50.0000 ug | INTRAMUSCULAR | Status: DC
  Administered 2019-11-18: 100 ug via INTRAVENOUS
  Filled 2019-11-18: qty 2

## 2019-11-18 MED ORDER — ONDANSETRON HCL 4 MG/2ML IJ SOLN
INTRAMUSCULAR | Status: AC
  Filled 2019-11-18: qty 2

## 2019-11-18 MED ORDER — ONDANSETRON HCL 4 MG/2ML IJ SOLN
INTRAMUSCULAR | Status: DC | PRN
  Administered 2019-11-18: 4 mg via INTRAVENOUS

## 2019-11-18 MED ORDER — SODIUM CHLORIDE 0.9 % IV SOLN
INTRAVENOUS | Status: DC | PRN
  Administered 2019-11-18: 40 mL

## 2019-11-18 MED ORDER — LIDOCAINE 2% (20 MG/ML) 5 ML SYRINGE
INTRAMUSCULAR | Status: AC
  Filled 2019-11-18: qty 5

## 2019-11-18 MED ORDER — LACTATED RINGERS IV SOLN: INTRAVENOUS | Status: DC

## 2019-11-18 MED ORDER — ACETAMINOPHEN 500 MG PO TABS
1000.0000 mg | ORAL_TABLET | Freq: Once | ORAL | Status: AC
  Administered 2019-11-18: 1000 mg via ORAL
  Filled 2019-11-18: qty 2

## 2019-11-18 MED ORDER — PROPOFOL 500 MG/50ML IV EMUL
INTRAVENOUS | Status: DC | PRN
  Administered 2019-11-18: 75 ug/kg/min via INTRAVENOUS

## 2019-11-18 MED ORDER — BUPIVACAINE HCL (PF) 0.25 % IJ SOLN
INTRAMUSCULAR | Status: DC | PRN
  Administered 2019-11-18: 30 mL

## 2019-11-18 MED ORDER — OXYCODONE HCL 5 MG/5ML PO SOLN: 5.0000 mg | Freq: Once | ORAL | Status: DC | PRN

## 2019-11-18 MED ORDER — DEXAMETHASONE SODIUM PHOSPHATE 10 MG/ML IJ SOLN
8.0000 mg | Freq: Once | INTRAMUSCULAR | Status: AC
  Administered 2019-11-18: 8 mg via INTRAVENOUS

## 2019-11-18 MED ORDER — DEXAMETHASONE SODIUM PHOSPHATE 10 MG/ML IJ SOLN
INTRAMUSCULAR | Status: AC
  Filled 2019-11-18: qty 1

## 2019-11-18 MED ORDER — MIDAZOLAM HCL 2 MG/2ML IJ SOLN
INTRAMUSCULAR | Status: AC
  Filled 2019-11-18: qty 2

## 2019-11-18 MED ORDER — MIDAZOLAM HCL 2 MG/2ML IJ SOLN
1.0000 mg | INTRAMUSCULAR | Status: DC
  Administered 2019-11-18: 2 mg via INTRAVENOUS
  Filled 2019-11-18: qty 2

## 2019-11-18 MED ORDER — PROPOFOL 10 MG/ML IV BOLUS
INTRAVENOUS | Status: AC
  Filled 2019-11-18: qty 40

## 2019-11-18 MED ORDER — OXYCODONE HCL 5 MG PO TABS: 5.0000 mg | ORAL_TABLET | Freq: Four times a day (QID) | ORAL | 0 refills | Status: AC | PRN

## 2019-11-18 MED ORDER — METHOCARBAMOL 500 MG PO TABS: 500.0000 mg | ORAL_TABLET | Freq: Four times a day (QID) | ORAL | 0 refills | Status: AC

## 2019-11-18 MED ORDER — SODIUM CHLORIDE 0.9 % IV SOLN
INTRAVENOUS | Status: DC | PRN
  Administered 2019-11-18: 20 mL via INTRAMUSCULAR

## 2019-11-18 MED ORDER — ROPIVACAINE HCL 7.5 MG/ML IJ SOLN
INTRAMUSCULAR | Status: DC | PRN
  Administered 2019-11-18 (×4): 5 mL via PERINEURAL

## 2019-11-18 MED ORDER — ASPIRIN EC 325 MG PO TBEC
325.0000 mg | DELAYED_RELEASE_TABLET | Freq: Two times a day (BID) | ORAL | 0 refills | Status: AC
Start: 2019-11-18 — End: 2020-11-17

## 2019-11-18 MED ORDER — PHENYLEPHRINE HCL-NACL 10-0.9 MG/250ML-% IV SOLN
INTRAVENOUS | Status: DC | PRN
  Administered 2019-11-18: 20 ug/min via INTRAVENOUS

## 2019-11-18 MED ORDER — FENTANYL CITRATE (PF) 100 MCG/2ML IJ SOLN: 25.0000 ug | INTRAMUSCULAR | Status: DC | PRN

## 2019-11-18 SURGICAL SUPPLY — 46 items
ATTUNE PSRP INSR SZ7 7 KNEE (Insert) ×1 IMPLANT
ATTUNE PSRP INSR SZ7 7MM KNEE (Insert) ×1 IMPLANT
BAG SPEC THK2 15X12 ZIP CLS (MISCELLANEOUS) ×1
BAG ZIPLOCK 12X15 (MISCELLANEOUS) ×3 IMPLANT
BNDG ELASTIC 6X5.8 VLCR STR LF (GAUZE/BANDAGES/DRESSINGS) ×3 IMPLANT
CLOSURE STERI-STRIP 1/2X4 (GAUZE/BANDAGES/DRESSINGS) ×2
CLOSURE WOUND 1/2 X4 (GAUZE/BANDAGES/DRESSINGS) ×2
CLSR STERI-STRIP ANTIMIC 1/2X4 (GAUZE/BANDAGES/DRESSINGS) ×2 IMPLANT
COVER SURGICAL LIGHT HANDLE (MISCELLANEOUS) ×3 IMPLANT
COVER WAND RF STERILE (DRAPES) ×2 IMPLANT
CUFF TOURN SGL QUICK 34 (TOURNIQUET CUFF) ×6
CUFF TRNQT CYL 34X4.125X (TOURNIQUET CUFF) ×2 IMPLANT
DECANTER SPIKE VIAL GLASS SM (MISCELLANEOUS) ×6 IMPLANT
DRAPE INCISE IOBAN 66X45 STRL (DRAPES) ×6 IMPLANT
DRAPE U-SHAPE 47X51 STRL (DRAPES) ×3 IMPLANT
DRESSING AQUACEL AG SP 3.5X10 (GAUZE/BANDAGES/DRESSINGS) IMPLANT
DRSG AQUACEL AG ADV 3.5X10 (GAUZE/BANDAGES/DRESSINGS) ×3 IMPLANT
DRSG AQUACEL AG SP 3.5X10 (GAUZE/BANDAGES/DRESSINGS) ×3
DURAPREP 26ML APPLICATOR (WOUND CARE) ×6 IMPLANT
ELECT REM PT RETURN 15FT ADLT (MISCELLANEOUS) ×3 IMPLANT
GLOVE BIOGEL PI IND STRL 8.5 (GLOVE) ×2 IMPLANT
GLOVE BIOGEL PI INDICATOR 8.5 (GLOVE) ×4
GLOVE SURG ORTHO 8.0 STRL STRW (GLOVE) ×9 IMPLANT
GOWN STRL REUS W/TWL XL LVL3 (GOWN DISPOSABLE) ×6 IMPLANT
HANDPIECE INTERPULSE COAX TIP (DISPOSABLE) ×3
HOOD PEEL AWAY FLYTE STAYCOOL (MISCELLANEOUS) ×9 IMPLANT
KIT TURNOVER KIT A (KITS) IMPLANT
MANIFOLD NEPTUNE II (INSTRUMENTS) ×3 IMPLANT
NEEDLE HYPO 22GX1.5 SAFETY (NEEDLE) ×3 IMPLANT
NS IRRIG 1000ML POUR BTL (IV SOLUTION) ×3 IMPLANT
PENCIL SMOKE EVACUATOR (MISCELLANEOUS) IMPLANT
PROTECTOR NERVE ULNAR (MISCELLANEOUS) ×3 IMPLANT
SET HNDPC FAN SPRY TIP SCT (DISPOSABLE) ×1 IMPLANT
STRIP CLOSURE SKIN 1/2X4 (GAUZE/BANDAGES/DRESSINGS) ×4 IMPLANT
SUT MNCRL AB 3-0 PS2 18 (SUTURE) ×3 IMPLANT
SUT STRATAFIX 0 PDS 27 VIOLET (SUTURE) ×3
SUT STRATAFIX PDS+ 0 24IN (SUTURE) ×3 IMPLANT
SUT VIC AB 1 CT1 36 (SUTURE) ×3 IMPLANT
SUTURE STRATFX 0 PDS 27 VIOLET (SUTURE) ×1 IMPLANT
SWAB COLLECTION DEVICE MRSA (MISCELLANEOUS) IMPLANT
SWAB CULTURE ESWAB REG 1ML (MISCELLANEOUS) IMPLANT
SYR CONTROL 10ML LL (SYRINGE) ×6 IMPLANT
TRAY FOLEY MTR SLVR 16FR STAT (SET/KITS/TRAYS/PACK) ×3 IMPLANT
WATER STERILE IRR 1000ML POUR (IV SOLUTION) ×3 IMPLANT
WRAP KNEE MAXI GEL POST OP (GAUZE/BANDAGES/DRESSINGS) ×3 IMPLANT
YANKAUER SUCT BULB TIP 10FT TU (MISCELLANEOUS) ×3 IMPLANT

## 2019-11-18 NOTE — Progress Notes (Signed)
Orthopedic Tech Progress Note Patient Details:  [name] [name] 06/05/1961 [id number]  Ortho Devices Type of Ortho Device: Bone foam zero knee   Post Interventions Patient Tolerated: Well Instructions Provided: Care of device   [name] 11/18/2019, 12:09 PM

## 2019-11-18 NOTE — Anesthesia Preprocedure Evaluation (Signed)
Anesthesia Evaluation  Patient identified by MRN, date of birth, ID band Patient awake    Reviewed: Allergy & Precautions, NPO status , Patient's Chart, lab work & pertinent test results  Airway Mallampati: II  TM Distance: >3 FB Neck ROM: Full    Dental  (+) Teeth Intact, Dental Advisory Given   Pulmonary neg pulmonary ROS,    breath sounds clear to auscultation       Cardiovascular hypertension, Pt. on medications  Rhythm:Regular Rate:Normal     Neuro/Psych negative neurological ROS     GI/Hepatic negative GI ROS, Neg liver ROS,   Endo/Other  negative endocrine ROS  Renal/GU negative Renal ROS     Musculoskeletal  (+) Arthritis ,   Abdominal Normal abdominal exam  (+)   Peds  Hematology negative hematology ROS (+)   Anesthesia Other Findings   Reproductive/Obstetrics                             Anesthesia Physical  Anesthesia Plan  ASA: II  Anesthesia Plan: Spinal   Post-op Pain Management:  Regional for Post-op pain   Induction: Intravenous  PONV Risk Score and Plan: 3 and Ondansetron, Propofol infusion and Midazolam  Airway Management Planned: Natural Airway and Mask  Additional Equipment: None  Intra-op Plan:   Post-operative Plan:   Informed Consent: I have reviewed the patients History and Physical, chart, labs and discussed the procedure including the risks, benefits and alternatives for the proposed anesthesia with the patient or authorized representative who has indicated his/her understanding and acceptance.       Plan Discussed with: CRNA  Anesthesia Plan Comments: (See PAT note 09/20/18, Jodell Cipro, PA-C)        Anesthesia Quick Evaluation

## 2019-11-18 NOTE — H&P (Signed)
[name] [name] MRN:  [id number] DOB/SEX:  1961/04/12/male  CHIEF COMPLAINT:  Painful right Knee  HISTORY: Patient is a 59 y.o. male presented with a history of pain in the right knee. Onset of symptoms was gradual starting a few months ago with gradually worsening course since that time. Patient has been treated conservatively with over-the-counter NSAIDs and activity modification. Patient currently rates pain in the knee at 10 out of 10 with activity. There is pain at night.  PAST MEDICAL HISTORY: Patient Active Problem List   Diagnosis Date Noted   Arthrofibrosis of total knee arthroplasty, initial encounter (HCC) 08/06/2018   Osteoarthritis of right knee 08/06/2018   History of resection of terminal ileum 06/28/2018   ETOH abuse 06/28/2018   Leukopenia 06/25/2018   Abdominal hernia 04/25/2013   Past Medical History:  Diagnosis Date   Arthritis    osteoarthritis   Environmental allergies    External hemorrhoid    Heart murmur       in 9th grade asymptomatic   Hypertension    Past Surgical History:  Procedure Laterality Date   APPENDECTOMY  08-20-2009   DX LAPAROSCOPY /  LYSIS ADHESIONS/  OPEN ILEOCECAL RESECTION  08-28-2009   cecal perforation   EVALUATION UNDER ANESTHESIA WITH HEMORRHOIDECTOMY N/A 03/12/2015   Procedure: EXAM UNDER ANESTHESIA WITH HEMORRHOIDECTOMY;  Surgeon: Axel Filler, MD;  Location: [facility];  Service: General;  Laterality: N/A;   KNEE ARTHROSCOPY W/ MENISCECTOMY Right 07-10-2006   KNEE CLOSED REDUCTION Right 09/24/2018   Procedure: CLOSED MANIPULATION KNEE;  Surgeon: Ollen Gross, MD;  Location: [facility];  Service: Orthopedics;  Laterality: Right;  Negative to COVID19 screening   TOTAL KNEE ARTHROPLASTY Right 08/06/2018   Procedure: TOTAL KNEE ARTHROPLASTY;  Surgeon: Ollen Gross, MD;  Location: [facility];  Service: Orthopedics;  Laterality: Right;     MEDICATIONS:   No medications prior to admission.     ALLERGIES:  No Known Allergies  REVIEW OF SYSTEMS:  A comprehensive review of systems was negative except for: Musculoskeletal: positive for arthralgias and bone pain   FAMILY HISTORY:   Family History  Problem Relation Age of Onset   Diabetes Father    Hypertension Father     SOCIAL HISTORY:   Social History   Tobacco Use   Smoking status: Never Smoker   Smokeless tobacco: Never Used  Substance Use Topics   Alcohol use: Yes    Comment: 12 beers on the weekends     EXAMINATION:  Vital signs in last 24 hours:    There were no vitals taken for this visit.  General Appearance:    Alert, cooperative, no distress, appears stated age  Head:    Normocephalic, without obvious abnormality, atraumatic  Eyes:    PERRL, conjunctiva/corneas clear, EOM's intact, fundi    benign, both eyes       Ears:    Normal TM's and external ear canals, both ears  Nose:   Nares normal, septum midline, mucosa normal, no drainage    or sinus tenderness  Throat:   Lips, mucosa, and tongue normal; teeth and gums normal  Neck:   Supple, symmetrical, trachea midline, no adenopathy;       thyroid:  No enlargement/tenderness/nodules; no carotid   bruit or JVD  Back:     Symmetric, no curvature, ROM normal, no CVA tenderness  Lungs:     Clear to auscultation bilaterally, respirations unlabored  Chest wall:    No tenderness or deformity  Heart:  Regular rate and rhythm, S1 and S2 normal, no murmur, rub   or gallop  Abdomen:     Soft, non-tender, bowel sounds active all four quadrants,    no masses, no organomegaly  Genitalia:    Normal male without lesion, discharge or tenderness  Rectal:    Normal tone, normal prostate, no masses or tenderness;   guaiac negative stool  Extremities:   Extremities normal, atraumatic, no cyanosis or edema  Pulses:   2+ and symmetric all extremities  Skin:   Skin color, texture, turgor normal, no rashes or lesions  Lymph nodes:   Cervical, supraclavicular,  and axillary nodes normal  Neurologic:   CNII-XII intact. Normal strength, sensation and reflexes      throughout    Musculoskeletal:  ROM 0-80, Ligaments intact,  Imaging Review Plain radiographs demonstrate s/p tka of the right knee. The overall alignment is neutral. The bone quality appears to be excellent for age and reported activity level.  Assessment/Plan: Arthrofibrosis right knee   The patient history, physical examination and imaging studies are consistent with arthrofibrosis of the right knee. The patient has failed conservative treatment.  The clearance notes were reviewed.  After discussion with the patient it was felt that synovectomy with poly exchange was indicated. The procedure,  risks, and benefits  were presented and reviewed. The risks including but not limited to aseptic loosening, infection, blood clots, vascular injury, stiffness, patella tracking problems complications among others were discussed. The patient acknowledged the explanation, agreed to proceed with the plan.  Preoperative templating of the joint replacement has been completed, documented, and submitted to the Operating Room personnel in order to optimize intra-operative equipment management.    Patient's anticipated LOS is less than 2 midnights, meeting these requirements: - Younger than 46 - Lives within 1 hour of care - Has a competent adult at home to recover with post-op recover - NO history of  - Chronic pain requiring opiods  - Diabetes  - Coronary Artery Disease  - Heart failure  - Heart attack  - Stroke  - DVT/VTE  - Cardiac arrhythmia  - Respiratory Failure/COPD  - Renal failure  - Anemia  - Advanced Liver disease       Donia Ast 11/18/2019, 7:01 AM

## 2019-11-18 NOTE — Anesthesia Procedure Notes (Signed)
Procedure Name: MAC Date/Time: 11/18/2019 9:52 AM Performed by: Eben Burow, CRNA Pre-anesthesia Checklist: Patient identified, Emergency Drugs available, Suction available, Patient being monitored and Timeout performed Oxygen Delivery Method: Simple face mask Placement Confirmation: positive ETCO2 Dental Injury: Teeth and Oropharynx as per pre-operative assessment

## 2019-11-18 NOTE — Anesthesia Postprocedure Evaluation (Signed)
Anesthesia Post Note  Patient: [name] [name]  Procedure(s) Performed: SYNOVECTOMY WITH POLY EXCHANGE (Right Knee)     Patient location during evaluation: PACU Anesthesia Type: Spinal Level of consciousness: awake Pain management: pain level controlled Vital Signs Assessment: post-procedure vital signs reviewed and stable Respiratory status: spontaneous breathing Cardiovascular status: stable Postop Assessment: no headache, no backache, spinal receding, patient able to bend at knees and no apparent nausea or vomiting Anesthetic complications: no    Last Vitals:  Vitals:   11/18/19 1200 11/18/19 1215  BP: 130/81 138/90  Pulse: 69 80  Resp: 19 15  Temp:  36.4 C  SpO2: 98% 100%    Last Pain:  Vitals:   11/18/19 1215  TempSrc:   PainSc: 0-No pain   Pain Goal: Patients Stated Pain Goal: 2 (11/18/19 0808)  LLE Motor Response: Purposeful movement (11/18/19 1215)   RLE Motor Response: Purposeful movement (11/18/19 1215)   L Sensory Level: S1-Sole of foot, small toes (11/18/19 1215) R Sensory Level: S1-Sole of foot, small toes (11/18/19 1215) Epidural/Spinal Function Patient able to flex knees: Yes (11/18/19 1215), Patient able to lift hips off bed: No (11/18/19 1215), Back pain beyond tenderness at insertion site: No (11/18/19 1215), Progressively worsening motor and/or sensory loss: No (11/18/19 1215), Bowel and/or bladder incontinence post epidural: No (11/18/19 1215)  Caren Macadam

## 2019-11-18 NOTE — Transfer of Care (Signed)
Immediate Anesthesia Transfer of Care Note  Patient: [name] [name]  Procedure(s) Performed: SYNOVECTOMY WITH POLY EXCHANGE (Right Knee)  Patient Location: PACU  Anesthesia Type:Spinal  Level of Consciousness: awake, drowsy and responds to stimulation  Airway & Oxygen Therapy: Patient Spontanous Breathing and Patient connected to face mask oxygen  Post-op Assessment: Report given to RN and Post -op Vital signs reviewed and stable  Post vital signs: Reviewed and stable  Last Vitals:  Vitals Value Taken Time  BP 130/79 11/18/19 1117  Temp 36.4 C 11/18/19 1116  Pulse 73 11/18/19 1118  Resp 14 11/18/19 1118  SpO2 100 % 11/18/19 1118  Vitals shown include unvalidated device data.  Last Pain:  Vitals:   11/18/19 1116  TempSrc:   PainSc: 0-No pain      Patients Stated Pain Goal: 2 (11/18/19 7308)  Complications: No apparent anesthesia complications

## 2019-11-18 NOTE — Anesthesia Procedure Notes (Signed)
Anesthesia Regional Block: Adductor canal block   Pre-Anesthetic Checklist: ,, timeout performed, Correct Patient, Correct Site, Correct Laterality, Correct Procedure, Correct Position, site marked, Risks and benefits discussed,  Surgical consent,  Pre-op evaluation,  At surgeon's request and post-op pain management  Laterality: Lower and Right  Prep: chloraprep       Needles:  Injection technique: Single-shot  Needle Type: Echogenic Stimulator Needle     Needle Length: 10cm  Needle Gauge: 21   Needle insertion depth: 3 cm   Additional Needles:   Procedures:,,,, ultrasound used (permanent image in chart),,,,  Narrative:  Start time: 11/18/2019 9:05 AM End time: 11/18/2019 9:15 AM Injection made incrementally with aspirations every 5 mL.  Performed by: Personally  Anesthesiologist: Leilani Able, MD

## 2019-11-18 NOTE — Progress Notes (Signed)
Assisted Dr. Leilani Able with right adductor canal  block. Side rails up, monitors on throughout procedure. See vital signs in flow sheet. Tolerated Procedure well.

## 2019-11-18 NOTE — Anesthesia Procedure Notes (Signed)
Spinal  Patient location during procedure: OR Start time: 11/18/2019 9:56 AM Staffing Performed: resident/CRNA  Anesthesiologist: Lyn Hollingshead, MD Resident/CRNA: Eben Burow, CRNA Preanesthetic Checklist Completed: patient identified, IV checked, site marked, risks and benefits discussed, surgical consent, monitors and equipment checked, pre-op evaluation and timeout performed Spinal Block Patient position: sitting Prep: DuraPrep and site prepped and draped Patient monitoring: continuous pulse ox, blood pressure, heart rate and cardiac monitor Approach: midline Location: L4-5 Injection technique: single-shot Needle Needle type: Pencan  Needle gauge: 24 G Needle length: 9 cm Assessment Sensory level: T4 Additional Notes Pt placed in sitting position, spinal kit expiration date checked and verified, + CSF, - heme, pt tolerated well. Dr Jillyn Hidden present and supervising throughout [place].

## 2019-11-19 ENCOUNTER — Encounter: Payer: Self-pay | Admitting: *Deleted

## 2019-11-19 NOTE — Op Note (Signed)
TOTAL KNEE REPLACEMENT OPERATIVE NOTE:  11/18/2019  7:37 AM  PATIENT:  [name] [name]  59 y.o. male  PRE-OPERATIVE DIAGNOSIS:  Rt knee arthrofibrosis  POST-OPERATIVE DIAGNOSIS:  Rt knee arthrofibrosis  PROCEDURE:  Procedure(s): SYNOVECTOMY WITH POLY EXCHANGE  SURGEON:  Surgeon(s): Dannielle Huh, MD  PHYSICIAN ASSISTANT: Laurier Nancy, PA-C  ANESTHESIA:   spinal  SPECIMEN: None  COUNTS:  Correct  TOURNIQUET:   Total Tourniquet Time Documented: Thigh (Right) - 27 minutes Total: Thigh (Right) - 27 minutes   DICTATION:  Indication for procedure:    The patient is a 59 y.o. male who has failed conservative treatment for Rt knee arthrofibrosis.  Informed consent was obtained prior to anesthesia. The risks versus benefits of the operation were explain and in a way the patient can, and did, understand.    Description of procedure:     The patient was taken to the operating room and placed under anesthesia.  The patient was positioned in the usual fashion taking care that all body parts were adequately padded and/or protected.  A tourniquet was applied and the leg prepped and draped in the usual sterile fashion.  The extremity was exsanguinated with the esmarch and tourniquet inflated to 250 mmHg.  Pre-operative range of motion was 80 degrees flexion.  The knee was in good balance and good alignement  A midline incision approximately 6-7 inches long was made with a #10 blade.  A new blade was used to make a parapatellar arthrotomy going 2-3 cm into the quadriceps tendon, over the patella, and alongside the medial aspect of the patellar tendon.  A synovectomy was then performed with the #10 blade and forceps. I then elevated the deep MCL off the medial tibial metaphysis subperiosteally around to the semimembranosus attachment.    I everted the patella and debrided the scar tissue away.  I then removed numerous adhesions under the distal quadricept tendon. This allowed normal  flexion. I evaluated gap balance in exstenison and found that a 23mm size 8 depuy attune RP  tibial poly was the appropriate insert.    I had excellent flexion/extension gap balance, excellent patella tracking.  Flexion was full and beyond 120 degrees; extension was zero.  The component was chosen and the staff opened them to me on the back table while the knee was lavaged copiously  The soft tissue was infiltrated with 60cc of exparel 1.3% through a 21 gauge needle.  The polyethylene tibial component was snapped into place and the knee placed in extension.  The capsule was infilltrated with a 60cc exparel/marcaine/saline mixture. The tourniquet was let down.  Hemostasis was obtained.  The arthrotomy was closed using a #1 stratofix running suture.  The deep soft tissues were closed with #0 vicryls and the subcuticular layer closed with #2-0 vicryl.  The skin was reapproximated and closed with 3.0 Monocryl.  The wound was covered with steristrips, aquacel dressing, and a TED stocking.   The patient was then awakened, extubated, and taken to the recovery room in stable condition.  BLOOD LOSS:  300cc COMPLICATIONS:  None.  PLAN OF CARE: Admit for overnight observation  PATIENT DISPOSITION:  PACU - hemodynamically stable.    Please fax a copy of this op note to my office at [phone] (please only include page 1 and 2 of the Case Information op note)

## 2021-08-19 ENCOUNTER — Other Ambulatory Visit: Payer: Self-pay | Admitting: Internal Medicine

## 2021-08-19 DIAGNOSIS — I739 Peripheral vascular disease, unspecified: Secondary | ICD-10-CM

## 2021-12-23 ENCOUNTER — Other Ambulatory Visit: Payer: Self-pay | Admitting: Internal Medicine

## 2021-12-23 DIAGNOSIS — I739 Peripheral vascular disease, unspecified: Secondary | ICD-10-CM

## 2021-12-28 ENCOUNTER — Ambulatory Visit
Admission: RE | Admit: 2021-12-28 | Discharge: 2021-12-28 | Disposition: A | Discharge status: Home / Self Care | Payer: Medicare PPO | Source: Ambulatory Visit | Attending: Internal Medicine | Admitting: Internal Medicine

## 2021-12-28 DIAGNOSIS — I739 Peripheral vascular disease, unspecified: Secondary | ICD-10-CM

## 2023-03-14 ENCOUNTER — Ambulatory Visit: Payer: Medicare PPO | Attending: Family Medicine | Admitting: Physical Therapy

## 2023-03-14 DIAGNOSIS — M79604 Pain in right leg: Secondary | ICD-10-CM | POA: Diagnosis present

## 2023-03-14 DIAGNOSIS — M5431 Sciatica, right side: Secondary | ICD-10-CM | POA: Insufficient documentation

## 2023-03-14 DIAGNOSIS — M5459 Other low back pain: Secondary | ICD-10-CM | POA: Diagnosis present

## 2023-03-14 DIAGNOSIS — R2681 Unsteadiness on feet: Secondary | ICD-10-CM | POA: Diagnosis present

## 2023-03-14 NOTE — Therapy (Addendum)
OUTPATIENT PHYSICAL THERAPY THORACOLUMBAR EVALUATION   Patient Name: [name] [name] MRN: [id number] DOB:04/23/1961, 62 y.o., male Today's Date: 03/14/2023  END OF SESSION:  PT End of Session - 03/14/23 0844     Visit Number 1    Number of Visits 7   with eval   Date for PT Re-Evaluation 04/18/23   to allow for scheduling delays   Authorization Type [employer]    PT Start Time [id number]    PT Stop Time 0922    PT Time Calculation (min) 38 min    Activity Tolerance Patient limited by pain    Behavior During Therapy Restless             Past Medical History:  Diagnosis Date   Arthritis    osteoarthritis   Environmental allergies    External hemorrhoid    Heart murmur       in 9th grade asymptomatic   Hypertension    Past Surgical History:  Procedure Laterality Date   APPENDECTOMY  08-20-2009   DX LAPAROSCOPY /  LYSIS ADHESIONS/  OPEN ILEOCECAL RESECTION  08-28-2009   cecal perforation   EVALUATION UNDER ANESTHESIA WITH HEMORRHOIDECTOMY N/A 03/12/2015   Procedure: EXAM UNDER ANESTHESIA WITH HEMORRHOIDECTOMY;  Surgeon: Axel Filler, MD;  Location: [facility];  Service: General;  Laterality: N/A;   KNEE ARTHROSCOPY W/ MENISCECTOMY Right 07-10-2006   KNEE CLOSED REDUCTION Right 09/24/2018   Procedure: CLOSED MANIPULATION KNEE;  Surgeon: Ollen Gross, MD;  Location: [facility];  Service: Orthopedics;  Laterality: Right;  Negative to COVID19 screening   SYNOVECTOMY WITH POLY EXCHANGE Right 11/18/2019   Procedure: SYNOVECTOMY WITH POLY EXCHANGE;  Surgeon: Dannielle Huh, MD;  Location: [facility];  Service: Orthopedics;  Laterality: Right;   TOTAL KNEE ARTHROPLASTY Right 08/06/2018   Procedure: TOTAL KNEE ARTHROPLASTY;  Surgeon: Ollen Gross, MD;  Location: [facility];  Service: Orthopedics;  Laterality: Right;   Patient Active Problem List   Diagnosis Date Noted   Arthrofibrosis of total knee arthroplasty, initial encounter (HCC) 08/06/2018   Osteoarthritis  of right knee 08/06/2018   History of resection of terminal ileum 06/28/2018   ETOH abuse 06/28/2018   Leukopenia 06/25/2018   Abdominal hernia 04/25/2013    PCP: Lindaann Pascal, PA-C   REFERRING PROVIDER: Loney Laurence, NP  REFERRING DIAG: M54.31 (ICD-10-CM) - Sciatica, right side  Rationale for Evaluation and Treatment: Rehabilitation  THERAPY DIAG:  Sciatica, right side  Pain in right leg  Other low back pain  Unsteadiness on feet  ONSET DATE: 03/03/2023  SUBJECTIVE:  SUBJECTIVE STATEMENT: Patient reports new onset of right sciatic nerve pain starting about 9 weeks ago without known cause. Reports feeling off balance when walking due to numbness in RLE. Feels more comfortable in sitting when bearing weight through right side. Patient strongly feels that this pain is impacting his quality of life.   PERTINENT HISTORY:  PMH includes resection of terminal ileum, ETOH abuse, heart murmur, R TKA (Feb 2020).   PAIN:  Are you having pain? Yes: NPRS scale: 10/10 Pain location: R side buttocks, leg, all the way into the ankle Pain description: sharp Aggravating factors: moving Relieving factors: tylenol  PRECAUTIONS: None  RED FLAGS: Bowel or bladder incontinence: No   WEIGHT BEARING RESTRICTIONS: No  FALLS:  Has patient fallen in last 6 months? No  LIVING ENVIRONMENT: Lives with: lives with their family and lives with their spouse Lives in: House/apartment Stairs: Yes: External: 4 steps; none Has following equipment at home: Single point cane  OCCUPATION: no  PLOF: Independent with gait and Independent with transfers  PATIENT GOALS: "Not walking around with a permanent cramp", "live"  NEXT MD VISIT: in 6 weeks  OBJECTIVE:  Note: Objective measures were completed at  Evaluation unless otherwise noted.  DIAGNOSTIC FINDINGS:  Per referring provider documentation: X-ray of lumbar spine indicates DDD of L3-L5, and L5-S1.  PATIENT SURVEYS:  Modified Oswestry 35/50, completely disabled   SCREENING FOR RED FLAGS: Bowel or bladder incontinence: No Spinal tumors: No Cauda equina syndrome: No Compression fracture: No Abdominal aneurysm: No  COGNITION: Overall cognitive status: Within functional limits for tasks assessed     SENSATION: Numbness and tingling in RLE- all the way down "like mosquito bites"   POSTURE:  compensatory posture with R lateral lean   LUMBAR ROM:   AROM eval  Flexion Limited, painful  Extension WNL, pain at end range  Right lateral flexion "  Left lateral flexion "  Right rotation "  Left rotation "   (Blank rows = not tested)  LOWER EXTREMITY ROM:     Active  Right eval Left eval  Hip flexion    Hip extension    Hip abduction    Hip adduction    Hip internal rotation    Hip external rotation Limited, cannot reach figure 4 Limited, cannot reach figure 4  Knee flexion ROM limited 2/2 TKA   Knee extension Lacking ~20* in sitting 2/2 TKA   Ankle dorsiflexion    Ankle plantarflexion    Ankle inversion    Ankle eversion     (Blank rows = not tested)  LOWER EXTREMITY MMT:    MMT Right eval Left eval  Hip flexion 4/5 4/5  Hip extension    Hip abduction    Hip adduction    Hip internal rotation    Hip external rotation    Knee flexion 5/5 5/5  Knee extension 5/5 5/5  Ankle dorsiflexion 5/5 5/5  Ankle plantarflexion    Ankle inversion    Ankle eversion     (Blank rows = not tested)  LUMBAR SPECIAL TESTS:  Slump test: positive  FUNCTIONAL MOBILITY:  Sit to Stand: increased symptoms when standing up from chair > sitting down Sit to Supine: increased time to complete, increased pain  GAIT: Distance walked: various clinic distances Assistive device utilized: None Level of assistance: Complete  Independence Comments: antalgic  TODAY'S TREATMENT:  PT Eval  Trialed supine sciatic nerve glides- pt cannot extend knee Trialed supine figure 4-  pt cannot attain position 2/2 limited hip ER Trialed seated figure 4- pt cannot attain position 2/2 limited hip ER Supine piriformis stretch via adduction Seated sciatic nerve glides  PATIENT EDUCATION:  Education details: HEP: Sciatic nerve glides, supine piriformis stretch, POC, goals Person educated: Patient Education method: Explanation, Demonstration, Tactile cues, and Handouts Education comprehension: verbalized understanding, returned demonstration, verbal cues required, tactile cues required, and needs further education  HOME EXERCISE PROGRAM: Access Code: [email or web address] URL: [email or web address] Date: 03/14/2023 Prepared by: Peter Congo  Exercises - Supine Piriformis Stretch with Leg Straight  - 1 x daily - 7 x weekly - 1 sets - 5 reps - 30 sec hold - Seated Sciatic Tensioner  - 1 x daily - 7 x weekly - 1 sets - 5-10 reps  ASSESSMENT:  CLINICAL IMPRESSION: Patient is a 62 year old male referred to Neuro OPPT for Sciatica on the right side. Pt's PMH is significant for: resection of terminal ileum, ETOH abuse, heart murmur, R TKA (Feb 2020). The following deficits were present during the exam: pain, decreased mobility and flexibility of low back, hips, and R knee, decreased strength of hip flexors, sensory disturbances in RLE. Pt would benefit from skilled PT to address these impairments and functional limitations to maximize functional mobility independence.   OBJECTIVE IMPAIRMENTS: Abnormal gait, decreased activity tolerance, decreased balance, decreased coordination, decreased endurance, decreased mobility, difficulty walking, decreased ROM, decreased strength, hypomobility, impaired perceived  functional ability, impaired flexibility, impaired sensation, postural dysfunction, and pain.   ACTIVITY LIMITATIONS: carrying, lifting, bending, sitting, standing, squatting, sleeping, stairs, transfers, bed mobility, bathing, toileting, dressing, self feeding, reach over head, and hygiene/grooming  PARTICIPATION LIMITATIONS: meal prep, cleaning, laundry, interpersonal relationship, driving, shopping, community activity, occupation, and yard work  PERSONAL FACTORS: 3+ comorbidities:   resection of terminal ileum, ETOH abuse, heart murmur, R TKA (Feb 2020) are also affecting patient's functional outcome.   REHAB POTENTIAL: Fair : has tried stretching provided by referring provider with no improvement in symptoms, R knee ROM limiting  CLINICAL DECISION MAKING: Stable/uncomplicated  EVALUATION COMPLEXITY: Low   GOALS: Goals reviewed with patient? Yes  SHORT TERM GOALS= LONG TERM GOALS: Target date: 04/04/2023  Pt will be independent with final HEP for improved strength, balance, transfers, gait, and self management of pain.  Baseline:  Goal status: INITIAL  2.  Pt will improve Modified Oswestry score to 29/50 (MCID 6 points) to demonstrate decreasing disability and increasing function with daily activities. Baseline: (03/14/23): 35/50 Goal status: INITIAL  3.  Pt will improve functional range of motion of bilateral hips to be able to attain figure 4 hip ER in supine or sitting. Baseline:  Goal status: INITIAL   PLAN:  PT FREQUENCY: 1-2x/week  PT DURATION:  6 sessions  PLANNED INTERVENTIONS: Therapeutic exercises, Therapeutic activity, Neuromuscular re-education, Balance training, Gait training, Patient/Family education, Self Care, Joint mobilization, Stair training, DME instructions, Dry Needling, Electrical stimulation, Spinal mobilization, Cryotherapy, Moist heat, Taping, Traction, Manual therapy, and Re-evaluation.  PLAN FOR NEXT SESSION: stretches for hips, low back, knee.  Sciatic nerve glides. Core strengthening and lumbar stabilization. Discuss pain management techniques possibly heat/ice, TENS, breathing techniques, sleep hygiene.    Beverely Low, Student-PT Peter Congo, PT, DPT, CSRS  03/14/2023, 10:00 AM

## 2023-03-15 ENCOUNTER — Other Ambulatory Visit: Payer: Self-pay | Admitting: Family Medicine

## 2023-03-15 ENCOUNTER — Encounter: Payer: Self-pay | Admitting: Family Medicine

## 2023-03-15 DIAGNOSIS — M5431 Sciatica, right side: Secondary | ICD-10-CM

## 2023-03-16 ENCOUNTER — Ambulatory Visit
Admission: RE | Admit: 2023-03-16 | Discharge: 2023-03-16 | Disposition: A | Discharge status: Home / Self Care | Payer: Medicare PPO | Source: Ambulatory Visit | Attending: Family Medicine | Admitting: Family Medicine

## 2023-03-16 DIAGNOSIS — M5431 Sciatica, right side: Secondary | ICD-10-CM

## 2023-03-28 ENCOUNTER — Ambulatory Visit: Payer: Medicare PPO | Admitting: Physical Therapy

## 2023-03-31 ENCOUNTER — Ambulatory Visit: Payer: Medicare PPO | Admitting: Physical Therapy

## 2023-04-04 ENCOUNTER — Ambulatory Visit: Payer: Medicare PPO | Admitting: Physical Therapy

## 2023-04-07 ENCOUNTER — Ambulatory Visit: Payer: Medicare PPO | Admitting: Physical Therapy

## 2023-04-11 ENCOUNTER — Ambulatory Visit: Payer: Medicare PPO | Admitting: Physical Therapy

## 2023-04-14 ENCOUNTER — Ambulatory Visit: Payer: Medicare PPO | Admitting: Physical Therapy

## 2023-04-18 ENCOUNTER — Ambulatory Visit: Payer: Medicare PPO | Admitting: Physical Therapy

## 2023-04-20 ENCOUNTER — Ambulatory Visit: Payer: Medicare PPO | Admitting: Physical Therapy

## 2023-04-25 ENCOUNTER — Ambulatory Visit: Payer: Medicare PPO | Admitting: Physical Therapy

## 2023-04-27 ENCOUNTER — Ambulatory Visit: Payer: Medicare PPO | Admitting: Physical Therapy

## 2023-05-02 ENCOUNTER — Ambulatory Visit: Payer: Medicare PPO | Admitting: Physical Therapy

## 2023-05-09 ENCOUNTER — Ambulatory Visit: Payer: Medicare PPO | Admitting: Physical Therapy

## 2023-05-16 ENCOUNTER — Ambulatory Visit: Payer: Medicare PPO | Admitting: Physical Therapy

## 2023-05-18 ENCOUNTER — Ambulatory Visit: Payer: Medicare PPO | Admitting: Physical Therapy

## 2023-05-21 ENCOUNTER — Other Ambulatory Visit: Payer: Self-pay

## 2023-05-21 ENCOUNTER — Emergency Department (HOSPITAL_COMMUNITY)
Admission: EM | Admit: 2023-05-21 | Discharge: 2023-05-21 | Disposition: A | Discharge status: Home / Self Care | Payer: Medicare PPO | Attending: Emergency Medicine | Admitting: Emergency Medicine

## 2023-05-21 ENCOUNTER — Emergency Department (HOSPITAL_COMMUNITY)
Admission: EM | Admit: 2023-05-21 | Discharge: 2023-05-21 | Disposition: A | Discharge status: Home / Self Care | Payer: Medicare PPO | Source: Home / Self Care | Attending: Emergency Medicine | Admitting: Emergency Medicine

## 2023-05-21 DIAGNOSIS — Z79899 Other long term (current) drug therapy: Secondary | ICD-10-CM | POA: Insufficient documentation

## 2023-05-21 DIAGNOSIS — R339 Retention of urine, unspecified: Secondary | ICD-10-CM | POA: Diagnosis present

## 2023-05-21 DIAGNOSIS — T83511A Infection and inflammatory reaction due to indwelling urethral catheter, initial encounter: Secondary | ICD-10-CM | POA: Diagnosis not present

## 2023-05-21 DIAGNOSIS — Y846 Urinary catheterization as the cause of abnormal reaction of the patient, or of later complication, without mention of misadventure at the time of the procedure: Secondary | ICD-10-CM | POA: Diagnosis not present

## 2023-05-21 DIAGNOSIS — I10 Essential (primary) hypertension: Secondary | ICD-10-CM | POA: Diagnosis not present

## 2023-05-21 DIAGNOSIS — N4889 Other specified disorders of penis: Secondary | ICD-10-CM | POA: Insufficient documentation

## 2023-05-21 DIAGNOSIS — N390 Urinary tract infection, site not specified: Secondary | ICD-10-CM | POA: Diagnosis not present

## 2023-05-21 LAB — URINALYSIS, ROUTINE W REFLEX MICROSCOPIC
Bilirubin Urine: NEGATIVE
Glucose, UA: NEGATIVE mg/dL
Ketones, ur: NEGATIVE mg/dL
Nitrite: POSITIVE — AB
Protein, ur: 100 mg/dL — AB
RBC / HPF: >50 RBC/hpf (ref 0–5)
Specific Gravity, Urine: 1.023 (ref 1.005–1.030)
WBC, UA: >50 WBC/hpf (ref 0–5)
pH: 5 (ref 5.0–8.0)

## 2023-05-21 MED ORDER — OXYCODONE-ACETAMINOPHEN 5-325 MG PO TABS
1.0000 | ORAL_TABLET | Freq: Once | ORAL | Status: AC
  Administered 2023-05-21: 1 via ORAL
  Filled 2023-05-21: qty 1

## 2023-05-21 MED ORDER — PHENAZOPYRIDINE HCL 200 MG PO TABS: 200.0000 mg | ORAL_TABLET | Freq: Three times a day (TID) | ORAL | 0 refills | Status: AC

## 2023-05-21 MED ORDER — DIAZEPAM 5 MG PO TABS
5.0000 mg | ORAL_TABLET | Freq: Once | ORAL | Status: AC
  Administered 2023-05-21: 5 mg via ORAL
  Filled 2023-05-21: qty 1

## 2023-05-21 MED ORDER — CEPHALEXIN 500 MG PO CAPS: 500.0000 mg | ORAL_CAPSULE | Freq: Four times a day (QID) | ORAL | 0 refills | Status: AC

## 2023-05-21 MED ORDER — LIDOCAINE HCL URETHRAL/MUCOSAL 2 % EX GEL
1.0000 | Freq: Once | CUTANEOUS | Status: AC
  Administered 2023-05-21: 1 via URETHRAL
  Filled 2023-05-21: qty 11

## 2023-05-21 NOTE — Discharge Instructions (Addendum)
It was a pleasure taking care of you today.  Your evaluated in the emergency room for catheter adjustment.  Your catheter was flushed and adjusted.  Please continue to take your oral antibiotics as prescribed. A small dose of pyridium was sent into your pharmacy as well.  This may help with any discomfort you feel.  If you experience any new or worsening symptoms such as fevers, chills, worsening penile pain please return to the emergency room.  Otherwise please keep your urology appointment as scheduled.

## 2023-05-21 NOTE — ED Triage Notes (Signed)
Pt arrived via POV. C/o drainage around foley catheter that began this AM. Pt has had foley for 1.5 weeks.  No c/o of pain in penis.

## 2023-05-21 NOTE — ED Provider Notes (Signed)
[place] EMERGENCY DEPARTMENT AT [facility] Provider Note   CSN: [id number] Arrival date & time: 05/21/23  1711     History  Chief Complaint  Patient presents with   Penis Pain    [name] [name] is a 62 y.o. male with urinary incontinence presents for penile pain.  He presented to the ED earlier tonight for similar complaints.  New catheter was placed this evening and he was started on Keflex.  Catheter was adjusted in triage and he reports improvement of symptoms. Penis Pain       Home Medications Prior to Admission medications   Medication Sig Start Date End Date Taking? Authorizing Provider  phenazopyridine (PYRIDIUM) 200 MG tablet Take 1 tablet (200 mg total) by mouth 3 (three) times daily. 05/21/23  Yes Halford Decamp, PA-C  acetaminophen (TYLENOL) 500 MG tablet Take 1,000 mg by mouth every 6 (six) hours as needed for moderate pain or headache.     [provider]  amLODipine (NORVASC) 10 MG tablet Take 10 mg by mouth daily. 06/28/18   [provider]  cephALEXin (KEFLEX) 500 MG capsule Take 1 capsule (500 mg total) by mouth 4 (four) times daily for 7 days. 05/21/23 05/28/23  Long, Arlyss Repress, MD  methocarbamol (ROBAXIN) 500 MG tablet Take 1-2 tablets (500-1,000 mg total) by mouth 4 (four) times daily. 11/18/19   Guy Sandifer, PA  oxyCODONE (OXY IR/ROXICODONE) 5 MG immediate release tablet Take 1-2 tablets (5-10 mg total) by mouth every 6 (six) hours as needed for severe pain. 11/18/19   Guy Sandifer, PA  sildenafil (VIAGRA) 100 MG tablet Take 0.5 tablets (50 mg total) by mouth daily as needed for erectile dysfunction. 10/16/18   Earl Many, MD  triamterene-hydrochlorothiazide (MAXZIDE-25) 37.5-25 MG tablet TAKE 1 (ONE) TABLET DAILY IN THE MORNING Patient taking differently: Take 1 tablet by mouth daily.  09/03/19   Yates Decamp, MD      Allergies    Patient has no known allergies.    Review of Systems   Review of Systems   Genitourinary:  Positive for penile pain.    Physical Exam Updated Vital Signs There were no vitals taken for this visit. Physical Exam Vitals and nursing note reviewed. Exam conducted with a chaperone present.  Constitutional:      General: He is not in acute distress.    Appearance: He is well-developed.  HENT:     Head: Normocephalic and atraumatic.  Eyes:     Conjunctiva/sclera: Conjunctivae normal.  Pulmonary:     Breath sounds: Normal breath sounds.  Genitourinary:    Comments: Catheter in place, does appear to have some purulent penile discharge, no scrotal erythema edema or swelling.  Bag does appear to have nonbloody urine Musculoskeletal:        General: No swelling.     Cervical back: Neck supple.  Skin:    General: Skin is warm and dry.     Capillary Refill: Capillary refill takes less than 2 seconds.  Neurological:     Mental Status: He is alert.  Psychiatric:        Mood and Affect: Mood normal.     ED Results / Procedures / Treatments   Labs (all labs ordered are listed, but only abnormal results are displayed) Labs Reviewed - No data to display  EKG None  Radiology No results found.  Procedures Procedures    Medications Ordered in ED Medications - No data to display  ED Course/  Medical Decision Making/ A&P                                 Medical Decision Making  This patient presents to the ED with chief complaint(s) of penile pain.  The complaint involves an extensive differential diagnosis and also carries with it a high risk of complications and morbidity.   pertinent past medical history as listed in HPI  The differential diagnosis includes  Catheter issue, UTI, epididymitis Additional history obtained: Additional history obtained from spouse Records reviewed previous admission documents  Initial Assessment:   16 French catheter in place, some purulent penile discharge noted, pain appears to be corresponding to penis position and  with catheter.  When strapped to leg he does have symptomatic relief.  Independent ECG interpretation:  none  Independent labs interpretation:  The following labs were independently interpreted:  none  Independent visualization and interpretation of imaging: none  Treatment and Reassessment: Catheter adjusted and secured to leg.  Reports improvement in pain  Consultations obtained:   none  Disposition:   Patient will be discharged home with Foley catheter in place.  Will continue antibiotics.  Pyridium sent into pharmacy for possible symptomatic relief.  Urology appointment next week. The patient has been appropriately medically screened and/or stabilized in the ED. I have low suspicion for any other emergent medical condition which would require further screening, evaluation or treatment in the ED or require inpatient management. At time of discharge the patient is hemodynamically stable and in no acute distress. I have discussed work-up results and diagnosis with patient and answered all questions. Patient is agreeable with discharge plan. We discussed strict return precautions for returning to the emergency department and they verbalized understanding.     Social Determinants of Health:   none  This note was dictated with voice recognition software.  Despite best efforts at proofreading, errors may have occurred which can change the documentation meaning.          Final Clinical Impression(s) / ED Diagnoses Final diagnoses:  Penile pain    Rx / DC Orders ED Discharge Orders          Ordered    phenazopyridine (PYRIDIUM) 200 MG tablet  3 times daily        05/21/23 1850              Fabienne Bruns 05/21/23 1901    Lorre Nick, MD 05/22/23 1156

## 2023-05-21 NOTE — ED Notes (Signed)
Pt is aware that a urine sample is needed.  

## 2023-05-21 NOTE — ED Notes (Signed)
Administered med to relax pt prior to foley insertion

## 2023-05-21 NOTE — ED Provider Notes (Signed)
Emergency Department Provider Note   I have reviewed the triage vital signs and the nursing notes.   HISTORY  Chief Complaint foley catheter problem   HPI [name] [name] is a 62 y.o. male with PMH reviewed including recent spine fusion at [facility] and resulting urinary retention. Foley has been in place for the last 1.5 weeks. No blood. No fever. Patient has noticed some pus coming from the urethra. No flank or abdominal pain.    Past Medical History:  Diagnosis Date   Arthritis    osteoarthritis   Environmental allergies    External hemorrhoid    Heart murmur       in 9th grade asymptomatic   Hypertension     Review of Systems  Constitutional: No fever/chills Eyes: No visual changes. ENT: No sore throat. Cardiovascular: Denies chest pain. Respiratory: Denies shortness of breath. Gastrointestinal: No abdominal pain.  No nausea, no vomiting.  No diarrhea.  No constipation. Genitourinary: Negative for dysuria. Musculoskeletal: Negative for back pain. Skin: Negative for rash. Neurological: Negative for headaches, focal weakness or numbness.  ____________________________________________   PHYSICAL EXAM:  VITAL SIGNS: ED Triage Vitals  Encounter Vitals Group     BP 05/21/23 1057 139/85     Pulse Rate 05/21/23 1057 97     Resp 05/21/23 1057 18     Temp 05/21/23 1057 98.1 F (36.7 C)     Temp Source 05/21/23 1057 Oral     SpO2 05/21/23 1057 96 %     Weight 05/21/23 1057 190 lb (86.2 kg)     Height 05/21/23 1057 5\' 9"  (1.753 m)   Constitutional: Alert and oriented. Well appearing and in no acute distress. Eyes: Conjunctivae are normal.  Head: Atraumatic. Nose: No congestion/rhinnorhea. Mouth/Throat: Mucous membranes are moist.   Cardiovascular: Normal rate, regular rhythm. Good peripheral circulation. Grossly normal heart sounds.   Respiratory: Normal respiratory effort.  No retractions. Lungs CTAB. Gastrointestinal: Soft and nontender. No distention.   Genitourinary: obtained verbal consent with chaperone at bedside. Minimal discharge from urethra. No visible blood or obvious trauma. Urine is dark yellow and draining.  Musculoskeletal: No lower extremity tenderness nor edema. No gross deformities of extremities. Neurologic:  Normal speech and language.  Skin:  Skin is warm, dry and intact. No rash noted.  ____________________________________________   LABS (all labs ordered are listed, but only abnormal results are displayed)  Labs Reviewed  URINE CULTURE - Abnormal; Notable for the following components:      Result Value   Culture >=100,000 COLONIES/mL SERRATIA MARCESCENS (*)    All other components within normal limits  URINALYSIS, ROUTINE W REFLEX MICROSCOPIC - Abnormal; Notable for the following components:   Color, Urine AMBER (*)    APPearance CLOUDY (*)    Hgb urine dipstick LARGE (*)    Protein, ur 100 (*)    Nitrite POSITIVE (*)    Leukocytes,Ua MODERATE (*)    Bacteria, UA MANY (*)    All other components within normal limits    ____________________________________________   PROCEDURES  Procedure(s) performed:   Procedures  None ____________________________________________   INITIAL IMPRESSION / ASSESSMENT AND PLAN / ED COURSE  Pertinent labs & imaging results that were available during my care of the patient were reviewed by me and considered in my medical decision making (see chart for details).   This patient is Presenting for Evaluation of urethra discharge/foley cath, which does require a range of treatment options, and is a complaint that involves a high risk  of morbidity and mortality.  The Differential Diagnoses include infection, yeast, urethral injury, hematuria, etc.  Critical Interventions-    Medications  diazepam (VALIUM) tablet 5 mg (5 mg Oral Given 05/21/23 1324)  lidocaine (XYLOCAINE) 2 % jelly 1 Application (1 Application Urethral Given 05/21/23 1353)  oxyCODONE-acetaminophen  (PERCOCET/ROXICET) 5-325 MG per tablet 1 tablet (1 tablet Oral Given 05/21/23 1416)    Reassessment after intervention: tolerated foley change well.    I did obtain Additional Historical Information from wife at bedside.   Clinical Laboratory Tests Ordered, included UA with evidence of infection. Will send culture.   Medical Decision Making: Summary:  Patient with discharge from urethra with foley cath in place. UA consistent with infection. Plan for abx, exchange foley, and send culture. Discussed risk/benefit with patient who is in agreement.   Reevaluation with update and discussion with patient. Foley exchanged without difficulty. Patient has urology follow up scheduled for later this month. Discussed strict return precautions.   Patient's presentation is most consistent with acute, uncomplicated illness.   Disposition: discharge  ____________________________________________  FINAL CLINICAL IMPRESSION(S) / ED DIAGNOSES  Final diagnoses:  Urinary tract infection associated with indwelling urethral catheter, initial encounter (HCC)     NEW OUTPATIENT MEDICATIONS STARTED DURING THIS VISIT:  Discharge Medication List as of 05/21/2023  2:43 PM     START taking these medications   Details  cephALEXin (KEFLEX) 500 MG capsule Take 1 capsule (500 mg total) by mouth 4 (four) times daily for 7 days., Starting Sun 05/21/2023, Until Sun 05/28/2023, Normal        Note:  This document was prepared using Dragon voice recognition software and may include unintentional dictation errors.  Alona Bene, MD, [facility] Emergency Medicine    Avaleigh Decuir, Arlyss Repress, MD 05/22/23 1425

## 2023-05-21 NOTE — ED Triage Notes (Signed)
Pt c/o pain in penis. Had catheter placed a few hours ago. Flushed and drained with no issue

## 2023-05-21 NOTE — Discharge Instructions (Signed)
You were seen in the emerged part today with urinary infection.  We have replaced your Foley catheter and I am starting you on antibiotics.  Please take as directed and keep your appointment with urology.  You should continue your Flomax.  If you develop fever, back/flank pain, blood in the urine you should return to the emergency department for reevaluation.

## 2023-05-23 ENCOUNTER — Ambulatory Visit: Payer: Medicare PPO | Admitting: Physical Therapy

## 2023-05-23 LAB — URINE CULTURE: Culture: >=100000 — AB

## 2023-05-24 ENCOUNTER — Telehealth (HOSPITAL_BASED_OUTPATIENT_CLINIC_OR_DEPARTMENT_OTHER): Payer: Self-pay

## 2023-05-24 NOTE — Telephone Encounter (Signed)
Post ED Visit - Positive Culture Follow-up: Unsuccessful Patient Follow-up  Culture assessed and recommendations reviewed by:  []  Enzo Bi, Pharm.D. [x]  Celedonio Miyamoto, Pharm.D., BCPS AQ-ID []  Garvin Fila, Pharm.D., BCPS []  Georgina Pillion, Pharm.D., BCPS []  [place], [address].D., BCPS, AAHIVP []  Estella Husk, Pharm.D., BCPS, AAHIVP []  Sherlynn Carbon, PharmD []  Pollyann Samples, PharmD, BCPS  Positive urine culture  []  Patient discharged without antimicrobial prescription and treatment is now indicated [x]  Organism is resistant to prescribed ED discharge antimicrobial []  Patient with positive blood cultures  Plan: Stop Cephalexin and start Bactrim DS 1 tablet po BID x 5 days per ED provider Estanislado Pandy, DO  Unable to contact patient after 3 attempts, letter will be sent to address on file  Sandria Senter 05/24/2023, 12:13 PM

## 2023-05-24 NOTE — Progress Notes (Signed)
ED Antimicrobial Stewardship Positive Culture Follow Up   [name] [name] is an 62 y.o. male who presented to [facility] on 05/21/2023 with a chief complaint of  Chief Complaint  Patient presents with   Penis Pain    Recent Results (from the past 720 hour(s))  Urine Culture     Status: Abnormal   Collection Time: 05/21/23 12:15 PM   Specimen: Urine, Catheterized  Result Value Ref Range Status   Specimen Description   Final    URINE, CATHETERIZED Performed at [facility], [address], [place], [place] [address]    Special Requests   Final    NONE Performed at [facility], [address], [place], [place] [address]    Culture >=100,000 COLONIES/mL SERRATIA MARCESCENS (A)  Final   Report Status 05/23/2023 FINAL  Final   Organism ID, Bacteria SERRATIA MARCESCENS (A)  Final      Susceptibility   Serratia marcescens - MIC*    CEFEPIME <=0.12 SENSITIVE Sensitive     CEFTRIAXONE <=0.25 SENSITIVE Sensitive     CIPROFLOXACIN <=0.25 SENSITIVE Sensitive     GENTAMICIN <=1 SENSITIVE Sensitive     NITROFURANTOIN 128 RESISTANT Resistant     TRIMETH/SULFA <=20 SENSITIVE Sensitive     * >=100,000 COLONIES/mL SERRATIA MARCESCENS    [x]  Treated with cephalexin, organism resistant to prescribed antimicrobial   New antibiotic prescription: bactrim DS 1 tablet BID x 5 days  ED Provider: Dr. Estanislado Pandy, DO   Mickeal Skinner 05/24/2023, 9:15 AM Clinical Pharmacist Monday - Friday phone -  [phone] Saturday - Sunday phone - [phone]

## 2023-05-30 ENCOUNTER — Telehealth (HOSPITAL_BASED_OUTPATIENT_CLINIC_OR_DEPARTMENT_OTHER): Payer: Self-pay | Admitting: *Deleted

## 2023-05-30 NOTE — Telephone Encounter (Signed)
Received call back in response to letter sent to address on file.  UTI symptoms still present, Bactrim DS 1 tab BID x 5 days Estanislado Pandy, MD) called to [facility] [phone]

## 2023-06-30 ENCOUNTER — Ambulatory Visit: Payer: Medicare PPO | Attending: Orthopedic Surgery | Admitting: Physical Therapy

## 2023-06-30 ENCOUNTER — Encounter: Payer: Self-pay | Admitting: Physical Therapy

## 2023-06-30 DIAGNOSIS — M6281 Muscle weakness (generalized): Secondary | ICD-10-CM | POA: Diagnosis present

## 2023-06-30 DIAGNOSIS — M79604 Pain in right leg: Secondary | ICD-10-CM | POA: Diagnosis present

## 2023-06-30 DIAGNOSIS — M5431 Sciatica, right side: Secondary | ICD-10-CM | POA: Insufficient documentation

## 2023-06-30 DIAGNOSIS — R208 Other disturbances of skin sensation: Secondary | ICD-10-CM | POA: Insufficient documentation

## 2023-06-30 DIAGNOSIS — R2681 Unsteadiness on feet: Secondary | ICD-10-CM | POA: Diagnosis present

## 2023-06-30 DIAGNOSIS — M5459 Other low back pain: Secondary | ICD-10-CM | POA: Diagnosis present

## 2023-06-30 NOTE — Therapy (Signed)
OUTPATIENT PHYSICAL THERAPY THORACOLUMBAR EVALUATION   Patient Name: [name] [name] MRN: [id number] DOB:November 21, 1960, 63 y.o., male Today's Date: 06/30/2023  END OF SESSION:  PT End of Session - 06/30/23 0934     Visit Number 1    Number of Visits 3   plus eval   Date for PT Re-Evaluation 07/21/23    Authorization Type [employer]    PT Start Time [phone]    PT Stop Time 1001   eval   PT Time Calculation (min) 30 min    Activity Tolerance Patient tolerated treatment well    Behavior During Therapy Agitated             Past Medical History:  Diagnosis Date   Arthritis    osteoarthritis   Environmental allergies    External hemorrhoid    Heart murmur       in 9th grade asymptomatic   Hypertension    Past Surgical History:  Procedure Laterality Date   APPENDECTOMY  08-20-2009   DX LAPAROSCOPY /  LYSIS ADHESIONS/  OPEN ILEOCECAL RESECTION  08-28-2009   cecal perforation   EVALUATION UNDER ANESTHESIA WITH HEMORRHOIDECTOMY N/A 03/12/2015   Procedure: EXAM UNDER ANESTHESIA WITH HEMORRHOIDECTOMY;  Surgeon: Axel Filler, MD;  Location: [facility];  Service: General;  Laterality: N/A;   KNEE ARTHROSCOPY W/ MENISCECTOMY Right 07-10-2006   KNEE CLOSED REDUCTION Right 09/24/2018   Procedure: CLOSED MANIPULATION KNEE;  Surgeon: Ollen Gross, MD;  Location: [facility];  Service: Orthopedics;  Laterality: Right;  Negative to COVID19 screening   SYNOVECTOMY WITH POLY EXCHANGE Right 11/18/2019   Procedure: SYNOVECTOMY WITH POLY EXCHANGE;  Surgeon: Dannielle Huh, MD;  Location: [facility];  Service: Orthopedics;  Laterality: Right;   TOTAL KNEE ARTHROPLASTY Right 08/06/2018   Procedure: TOTAL KNEE ARTHROPLASTY;  Surgeon: Ollen Gross, MD;  Location: [facility];  Service: Orthopedics;  Laterality: Right;   Patient Active Problem List   Diagnosis Date Noted   Arthrofibrosis of total knee arthroplasty, initial encounter (HCC) 08/06/2018   Osteoarthritis of right knee  08/06/2018   History of resection of terminal ileum 06/28/2018   ETOH abuse 06/28/2018   Leukopenia 06/25/2018   Abdominal hernia 04/25/2013    PCP: Not in system   REFERRING PROVIDER: Passias, Elease Hashimoto, MD  REFERRING DIAG:  M51.16 (ICD-10-CM) - Intervertebral disc disorders with radiculopathy, lumbar region    Rationale for Evaluation and Treatment: Rehabilitation  THERAPY DIAG:  Unsteadiness on feet  Muscle weakness (generalized)  Other disturbances of skin sensation  ONSET DATE: 06/26/2023 (referral)   SUBJECTIVE:  SUBJECTIVE STATEMENT: Pt returns to clinic following evaluation in October w/SPC. States he had two surgeries to L5 at [facility] performed in November, "They took the disc off of my nerves". States he had his incisions removed this week. Reports his RLE sciatica is a lot better, but he now feels like his feet are concrete and he has numbness in saddle region. States "it feels like there is a suction cup underneath my balls", perseverating on getting massage therapy to genital region and that is his goal for PT.   PERTINENT HISTORY:  resection of terminal ileum, ETOH abuse, heart murmur, R TKA (Feb 2020).  PAIN:  Are you having pain? No  PRECAUTIONS: Back and Fall  RED FLAGS: Numbness in saddle region     WEIGHT BEARING RESTRICTIONS: No  FALLS:  Has patient fallen in last 6 months? No  LIVING ENVIRONMENT: Lives with: lives with their spouse Lives in: House/apartment Stairs: Yes: External: 3 steps; on left going up Has following equipment at home: Single point cane  OCCUPATION: Not employed   PLOF: Independent  PATIENT GOALS: "I already told you that if you would listen to what I said"   NEXT MD VISIT: 08/11/23  OBJECTIVE:  Note: Objective measures were completed at  Evaluation unless otherwise noted.  DIAGNOSTIC FINDINGS:  MRI of Lumbar spine from 03/16/23 (pre-surgery)   IMPRESSION: 1. Broad-based central/right subarticular zone protrusion and mild facet arthropathy at L5-S1 resulting in right subarticular zone narrowing which may affect the traversing right S1 nerve root. Correlate with radicular symptoms. 2. Moderate right worse than left facet arthropathy at L4-L5 with trace perifacetal soft tissue edema which may reflect a source of pain.  COGNITION: Overall cognitive status: Difficulty to assess due to: no family present     SENSATION: Numbness of saddle region, reports bilateral feet feels like concrete    POSTURE: rounded shoulders and forward head   LUMBAR ROM: not assessed as pt reports this is not important   AROM eval  Flexion   Extension   Right lateral flexion   Left lateral flexion   Right rotation   Left rotation    (Blank rows = not tested)  LOWER EXTREMITY ROM:     Active  Right eval Left eval  Hip flexion    Hip extension    Hip abduction    Hip adduction    Hip internal rotation    Hip external rotation    Knee flexion    Knee extension -15   Ankle dorsiflexion    Ankle plantarflexion    Ankle inversion    Ankle eversion     (Blank rows = not tested)  LOWER EXTREMITY MMT:  Tested in seated position   MMT Right eval Left eval  Hip flexion 4+ 5  Hip extension    Hip abduction 5 5  Hip adduction 5 5  Hip internal rotation    Hip external rotation    Knee flexion 5 5  Knee extension 5 5  Ankle dorsiflexion 5 5  Ankle plantarflexion    Ankle inversion    Ankle eversion     (Blank rows = not tested)  FUNCTIONAL TESTS:   [facility] PT Assessment - 06/30/23 0951       Transfers   Five time sit to stand comments  24.56s   Wide BOS, staggered stance (LLE posterior), lateral instability, required BUE support     Ambulation/Gait   Gait velocity 93m over 13.22s = 0.76 m/s no AD  SBA              GAIT: Distance walked: Various clinic distances  Assistive device utilized: Single point cane and None Level of assistance: SBA Comments: Pt ambulates w/wide BOS and decreased eccentric control on RLE. When discussing gait impairments with pt, pt very agitated, stating that "obviously" he does not walk straight because he "had back surgeries".   TREATMENT DATE: N/A eval                                                                                                                                  PATIENT EDUCATION:  Education details: POC, eval findings, difference between massage therapist and PT, importance of exercise for recovery, nerve regeneration timeline, role of neuro PT Person educated: Patient Education method: Explanation Education comprehension: needs further education  HOME EXERCISE PROGRAM: To be established - pt just wants massage   ASSESSMENT:  CLINICAL IMPRESSION: Patient is a 63 year old male referred to Neuro [facility] for disc disorder of lumbar spine.   Pt's PMH is significant for: resection of terminal ileum, ETOH abuse, heart murmur, R TKA (Feb 2020). The following deficits were present during the exam: decreased functional strength (per 5x STS), decreased knowledge of condition, impaired sensation and decreased activity tolerance. Pt perseverating on deep tissue massage, requiring max redirection and education on importance of exercise. Pt reports he feels as though this is a "waste of time" but is going to try it for 1-2 visits. Pt would benefit from skilled PT to address these impairments and functional limitations to maximize functional mobility independence.    OBJECTIVE IMPAIRMENTS: Abnormal gait, decreased activity tolerance, decreased balance, decreased endurance, decreased knowledge of condition, decreased knowledge of use of DME, decreased mobility, difficulty walking, decreased ROM, decreased strength, decreased safety awareness, impaired sensation, and  improper body mechanics.   ACTIVITY LIMITATIONS: carrying, lifting, bending, squatting, transfers, and locomotion level  PARTICIPATION LIMITATIONS: cleaning, shopping, community activity, and yard work  PERSONAL FACTORS: Behavior pattern, Fitness, Past/current experiences, and 1 comorbidity: Recent L5 decompression  are also affecting patient's functional outcome.   REHAB POTENTIAL: Fair due to poor compliance w/PT and agitation  CLINICAL DECISION MAKING: Stable/uncomplicated  EVALUATION COMPLEXITY: Low   GOALS: Goals reviewed with patient? Yes  STG = LTG due to POC length     LONG TERM GOALS: Target date: 07/21/2023   Pt will be independent with HEP for improved strength, balance, transfers and gait.  Baseline: not established on eval  Goal status: INITIAL  2.  Pt will improve 5 x STS to less than or equal to 20 seconds w/improved body mechanics to demonstrate improved functional strength and transfer efficiency.   Baseline: 24.56s w/poor body mechanics  Goal status: INITIAL   PLAN:  PT FREQUENCY: 1x/week  PT DURATION: 2 weeks  PLANNED INTERVENTIONS: 97164- PT Re-evaluation, 97110-Therapeutic exercises, 97530- Therapeutic activity, [id number]- Neuromuscular re-education, 97535- Self Care, [id number]- Manual therapy, [id number]- Gait  training, 82956- Aquatic Therapy, [phone]- Electrical stimulation (manual), Patient/Family education, Balance training, Dry Needling, Joint mobilization, Spinal manipulation, Spinal mobilization, Scar mobilization, DME instructions, Cryotherapy, and Moist heat.  PLAN FOR NEXT SESSION: Pt only wants to work on deep tissue massage and saddle region numbness. Establish HEP for deep tissue techniques he can do at home. Pt needs to work on functional strength of RLE but is declining at eval.    Jill Alexanders Azazel Franze, PT, DPT 06/30/2023, 10:05 AM

## 2023-07-04 ENCOUNTER — Ambulatory Visit: Payer: Medicare PPO

## 2023-07-04 DIAGNOSIS — R208 Other disturbances of skin sensation: Secondary | ICD-10-CM

## 2023-07-04 DIAGNOSIS — R2681 Unsteadiness on feet: Secondary | ICD-10-CM | POA: Diagnosis not present

## 2023-07-04 DIAGNOSIS — M6281 Muscle weakness (generalized): Secondary | ICD-10-CM

## 2023-07-04 NOTE — Therapy (Signed)
OUTPATIENT PHYSICAL THERAPY THORACOLUMBAR EVALUATION   Patient Name: [name] [name] MRN: [id number] DOB:[birth year], 63 y.o., male Today's Date: 07/04/2023  END OF SESSION:  PT End of Session - 07/04/23 1022     Visit Number 2    Number of Visits 3   plus eval   Date for PT Re-Evaluation 07/21/23    Authorization Type [employer]    PT Start Time 1025    PT Stop Time 1105    PT Time Calculation (min) 40 min    Activity Tolerance Patient tolerated treatment well    Behavior During Therapy Agitated             Past Medical History:  Diagnosis Date   Arthritis    osteoarthritis   Environmental allergies    External hemorrhoid    Heart murmur       in 9th grade asymptomatic   Hypertension    Past Surgical History:  Procedure Laterality Date   APPENDECTOMY  08-20-2009   DX LAPAROSCOPY /  LYSIS ADHESIONS/  OPEN ILEOCECAL RESECTION  08-28-2009   cecal perforation   EVALUATION UNDER ANESTHESIA WITH HEMORRHOIDECTOMY N/A 03/12/2015   Procedure: EXAM UNDER ANESTHESIA WITH HEMORRHOIDECTOMY;  Surgeon: Axel Filler, MD;  Location: [facility] [place];  Service: General;  Laterality: N/A;   KNEE ARTHROSCOPY W/ MENISCECTOMY Right 07-10-2006   KNEE CLOSED REDUCTION Right 09/24/2018   Procedure: CLOSED MANIPULATION KNEE;  Surgeon: Ollen Gross, MD;  Location: [facility];  Service: Orthopedics;  Laterality: Right;  Negative to COVID19 screening   SYNOVECTOMY WITH POLY EXCHANGE Right 11/18/2019   Procedure: SYNOVECTOMY WITH POLY EXCHANGE;  Surgeon: Dannielle Huh, MD;  Location: [facility];  Service: Orthopedics;  Laterality: Right;   TOTAL KNEE ARTHROPLASTY Right 08/06/2018   Procedure: TOTAL KNEE ARTHROPLASTY;  Surgeon: Ollen Gross, MD;  Location: [facility];  Service: Orthopedics;  Laterality: Right;   Patient Active Problem List   Diagnosis Date Noted   Arthrofibrosis of total knee arthroplasty, initial encounter (HCC) 08/06/2018   Osteoarthritis of right knee  08/06/2018   History of resection of terminal ileum 06/28/2018   ETOH abuse 06/28/2018   Leukopenia 06/25/2018   Abdominal hernia 04/25/2013    PCP: Not in system   REFERRING PROVIDER: Passias, Elease Hashimoto, MD  REFERRING DIAG:  M51.16 (ICD-10-CM) - Intervertebral disc disorders with radiculopathy, lumbar region    Rationale for Evaluation and Treatment: Rehabilitation  THERAPY DIAG:  Unsteadiness on feet  Muscle weakness (generalized)  Other disturbances of skin sensation  ONSET DATE: 06/26/2023 (referral)   SUBJECTIVE:  SUBJECTIVE STATEMENT: Pt reports constant numbness from heel to outside of foot and toes 3-5 with stiffness in foot on R>L. Pt reports no pain in back. Pt using st. Cane.   PERTINENT HISTORY:  resection of terminal ileum, ETOH abuse, heart murmur, R TKA (Feb 2020).  PAIN:  Are you having pain? No  PRECAUTIONS: Back and Fall  RED FLAGS: Numbness in saddle region     WEIGHT BEARING RESTRICTIONS: No  FALLS:  Has patient fallen in last 6 months? No  LIVING ENVIRONMENT: Lives with: lives with their spouse Lives in: House/apartment Stairs: Yes: External: 3 steps; on left going up Has following equipment at home: Single point cane  OCCUPATION: Not employed   PLOF: Independent  PATIENT GOALS: "I already told you that if you would listen to what I said"   NEXT MD VISIT: 08/11/23  OBJECTIVE:  Note: Objective measures were completed at Evaluation unless otherwise noted.  DIAGNOSTIC FINDINGS:  MRI of Lumbar spine from 03/16/23 (pre-surgery)   IMPRESSION: 1. Broad-based central/right subarticular zone protrusion and mild facet arthropathy at L5-S1 resulting in right subarticular zone narrowing which may affect the traversing right S1 nerve root. Correlate with  radicular symptoms. 2. Moderate right worse than left facet arthropathy at L4-L5 with trace perifacetal soft tissue edema which may reflect a source of pain.  COGNITION: Overall cognitive status: Difficulty to assess due to: no family present     SENSATION: Numbness of saddle region, reports bilateral feet feels like concrete    POSTURE: rounded shoulders and forward head   LUMBAR ROM: not assessed as pt reports this is not important   AROM eval  Flexion   Extension   Right lateral flexion   Left lateral flexion   Right rotation   Left rotation    (Blank rows = not tested)  LOWER EXTREMITY ROM:     Active  Right eval Left eval  Hip flexion    Hip extension    Hip abduction    Hip adduction    Hip internal rotation    Hip external rotation    Knee flexion    Knee extension -15   Ankle dorsiflexion    Ankle plantarflexion    Ankle inversion    Ankle eversion     (Blank rows = not tested)  LOWER EXTREMITY MMT:  Tested in seated position   MMT Right eval Left eval  Hip flexion 4+ 5  Hip extension    Hip abduction 5 5  Hip adduction 5 5  Hip internal rotation    Hip external rotation    Knee flexion 5 5  Knee extension 5 5  Ankle dorsiflexion 5 5  Ankle plantarflexion    Ankle inversion    Ankle eversion     (Blank rows = not tested)  FUNCTIONAL TESTS:      GAIT: Distance walked: Various clinic distances  Assistive device utilized: Single point cane and None Level of assistance: SBA Comments: Pt ambulates w/wide BOS and decreased eccentric control on RLE. When discussing gait impairments with pt, pt very agitated, stating that "obviously" he does not walk straight because he "had back surgeries".   TREATMENT DATE:  07/04/23                 Pt has significant swelling over surgical area in lumbar spine. Grade I-II PA mobilization in prone at L2-3 Performed effleurage over lumbar spine to help decrease swelling Prone on elbows; 3 x 30"  Pt  asked  to walk, pt reported no change in his symptoms with above interventions.         Supine lower trunk rotations: 10x R and L Supine knee to chest: 10 x 10" holds R and L Supine bridge: 10x                                                                       Ice to lumbar spine for 15-20' Ice started 1105   PATIENT EDUCATION:  Education details: POC, eval findings, difference between massage therapist and PT, importance of exercise for recovery, nerve regeneration timeline, role of neuro PT Person educated: Patient Education method: Explanation Education comprehension: needs further education  HOME EXERCISE PROGRAM: Access Code: [email or web address] URL: [email or web address] Date: 07/04/2023 Prepared by: Lavone Nian  Exercises - Supine Lower Trunk Rotation  - 2 x daily - 7 x weekly - 10 reps - Hooklying Single Knee to Chest  - 2 x daily - 7 x weekly - 10 reps - 10 hold - Static Prone on Elbows  - 2 x daily - 7 x weekly - 2-3 reps - 30 sec hold - Supine Bridge  - 1 x daily - 7 x weekly - 2 sets - 10 reps  ASSESSMENT:  CLINICAL IMPRESSION: We attempted light manual therapy to reduce the swelling and initiated extension based exericses. Pt reported no change in symptoms with exercises or light manual therapy.   OBJECTIVE IMPAIRMENTS: Abnormal gait, decreased activity tolerance, decreased balance, decreased endurance, decreased knowledge of condition, decreased knowledge of use of DME, decreased mobility, difficulty walking, decreased ROM, decreased strength, decreased safety awareness, impaired sensation, and improper body mechanics.   ACTIVITY LIMITATIONS: carrying, lifting, bending, squatting, transfers, and locomotion level  PARTICIPATION LIMITATIONS: cleaning, shopping, community activity, and yard work  PERSONAL FACTORS: Behavior pattern, Fitness, Past/current experiences, and 1 comorbidity: Recent L5 decompression  are also affecting patient's functional outcome.    REHAB POTENTIAL: Fair due to poor compliance w/PT and agitation  CLINICAL DECISION MAKING: Stable/uncomplicated  EVALUATION COMPLEXITY: Low   GOALS: Goals reviewed with patient? Yes  STG = LTG due to POC length     LONG TERM GOALS: Target date: 07/21/2023   Pt will be independent with HEP for improved strength, balance, transfers and gait.  Baseline: not established on eval  Goal status: INITIAL  2.  Pt will improve 5 x STS to less than or equal to 20 seconds w/improved body mechanics to demonstrate improved functional strength and transfer efficiency.   Baseline: 24.56s w/poor body mechanics  Goal status: INITIAL   PLAN:  PT FREQUENCY: 1x/week  PT DURATION: 2 weeks  PLANNED INTERVENTIONS: 97164- PT Re-evaluation, 97110-Therapeutic exercises, 97530- Therapeutic activity, 97112- Neuromuscular re-education, 97535- Self Care, 46962- Manual therapy, [phone]- Gait training, [phone]- Aquatic Therapy, [phone]- Electrical stimulation (manual), Patient/Family education, Balance training, Dry Needling, Joint mobilization, Spinal manipulation, Spinal mobilization, Scar mobilization, DME instructions, Cryotherapy, and Moist heat.  PLAN FOR NEXT SESSION: Pt only wants to work on deep tissue massage and saddle region numbness. Establish HEP for deep tissue techniques he can do at home. Pt needs to work on functional strength of RLE but is declining at eval.    Ileana Ladd, PT, DPT 07/04/2023, 11:04 AM

## 2023-07-11 ENCOUNTER — Ambulatory Visit: Payer: Medicare PPO

## 2023-07-11 DIAGNOSIS — M6281 Muscle weakness (generalized): Secondary | ICD-10-CM

## 2023-07-11 DIAGNOSIS — M5459 Other low back pain: Secondary | ICD-10-CM

## 2023-07-11 DIAGNOSIS — M5431 Sciatica, right side: Secondary | ICD-10-CM

## 2023-07-11 DIAGNOSIS — M79604 Pain in right leg: Secondary | ICD-10-CM

## 2023-07-11 DIAGNOSIS — R2681 Unsteadiness on feet: Secondary | ICD-10-CM

## 2023-07-11 NOTE — Addendum Note (Signed)
Addended by: Ileana Ladd on: 07/11/2023 11:11 AM   Modules accepted: Orders

## 2023-07-11 NOTE — Therapy (Addendum)
OUTPATIENT PHYSICAL THERAPY THORACOLUMBAR EVALUATION   Patient Name: [name] [name] MRN: [id number] DOB:1960-09-21, 63 y.o., male Today's Date: 07/11/2023  END OF SESSION:  PT End of Session - 07/11/23 1016     Visit Number 3    Number of Visits 13   plus eval   Date for PT Re-Evaluation 09/19/23    Authorization Type [employer]    PT Start Time 1015    PT Stop Time 1100    PT Time Calculation (min) 45 min    Activity Tolerance Patient tolerated treatment well    Behavior During Therapy Agitated             Past Medical History:  Diagnosis Date   Arthritis    osteoarthritis   Environmental allergies    External hemorrhoid    Heart murmur       in 9th grade asymptomatic   Hypertension    Past Surgical History:  Procedure Laterality Date   APPENDECTOMY  08-20-2009   DX LAPAROSCOPY /  LYSIS ADHESIONS/  OPEN ILEOCECAL RESECTION  08-28-2009   cecal perforation   EVALUATION UNDER ANESTHESIA WITH HEMORRHOIDECTOMY N/A 03/12/2015   Procedure: EXAM UNDER ANESTHESIA WITH HEMORRHOIDECTOMY;  Surgeon: Axel Filler, MD;  Location: [facility] [place];  Service: General;  Laterality: N/A;   KNEE ARTHROSCOPY W/ MENISCECTOMY Right 07-10-2006   KNEE CLOSED REDUCTION Right 09/24/2018   Procedure: CLOSED MANIPULATION KNEE;  Surgeon: Ollen Gross, MD;  Location: [facility];  Service: Orthopedics;  Laterality: Right;  Negative to COVID19 screening   SYNOVECTOMY WITH POLY EXCHANGE Right 11/18/2019   Procedure: SYNOVECTOMY WITH POLY EXCHANGE;  Surgeon: Dannielle Huh, MD;  Location: [facility];  Service: Orthopedics;  Laterality: Right;   TOTAL KNEE ARTHROPLASTY Right 08/06/2018   Procedure: TOTAL KNEE ARTHROPLASTY;  Surgeon: Ollen Gross, MD;  Location: [facility];  Service: Orthopedics;  Laterality: Right;   Patient Active Problem List   Diagnosis Date Noted   Arthrofibrosis of total knee arthroplasty, initial encounter (HCC) 08/06/2018   Osteoarthritis of right knee  08/06/2018   History of resection of terminal ileum 06/28/2018   ETOH abuse 06/28/2018   Leukopenia 06/25/2018   Abdominal hernia 04/25/2013    PCP: Not in system   REFERRING PROVIDER: Passias, Elease Hashimoto, MD  REFERRING DIAG:  M51.16 (ICD-10-CM) - Intervertebral disc disorders with radiculopathy, lumbar region    Rationale for Evaluation and Treatment: Rehabilitation  THERAPY DIAG:  Unsteadiness on feet  Muscle weakness (generalized)  Sciatica, right side  Pain in right leg  Other low back pain  ONSET DATE: 06/26/2023 (referral)   SUBJECTIVE:  SUBJECTIVE STATEMENT: Pt reports no change in symptoms, has been doing HEP at home and feels that it is making him little stronger.    PERTINENT HISTORY:  resection of terminal ileum, ETOH abuse, heart murmur, R TKA (Feb 2020).  PAIN:  Are you having pain? No  PRECAUTIONS: Back and Fall  RED FLAGS: Numbness in saddle region     WEIGHT BEARING RESTRICTIONS: No  FALLS:  Has patient fallen in last 6 months? No  LIVING ENVIRONMENT: Lives with: lives with their spouse Lives in: House/apartment Stairs: Yes: External: 3 steps; on left going up Has following equipment at home: Single point cane  OCCUPATION: Not employed   PLOF: Independent  PATIENT GOALS: "I already told you that if you would listen to what I said"   NEXT MD VISIT: 08/11/23  OBJECTIVE:  Note: Objective measures were completed at Evaluation unless otherwise noted.  DIAGNOSTIC FINDINGS:  MRI of Lumbar spine from 03/16/23 (pre-surgery)   IMPRESSION: 1. Broad-based central/right subarticular zone protrusion and mild facet arthropathy at L5-S1 resulting in right subarticular zone narrowing which may affect the traversing right S1 nerve root. Correlate with radicular  symptoms. 2. Moderate right worse than left facet arthropathy at L4-L5 with trace perifacetal soft tissue edema which may reflect a source of pain.  COGNITION: Overall cognitive status: Difficulty to assess due to: no family present     SENSATION: Numbness of saddle region, reports bilateral feet feels like concrete    POSTURE: rounded shoulders and forward head   LUMBAR ROM: not assessed as pt reports this is not important   AROM eval  Flexion   Extension   Right lateral flexion   Left lateral flexion   Right rotation   Left rotation    (Blank rows = not tested)  LOWER EXTREMITY ROM:     Active  Right eval Left eval  Hip flexion    Hip extension    Hip abduction    Hip adduction    Hip internal rotation    Hip external rotation    Knee flexion    Knee extension -15   Ankle dorsiflexion    Ankle plantarflexion    Ankle inversion    Ankle eversion     (Blank rows = not tested)  LOWER EXTREMITY MMT:  Tested in seated position   MMT Right eval Left eval  Hip flexion 4+ 5  Hip extension    Hip abduction 5 5  Hip adduction 5 5  Hip internal rotation    Hip external rotation    Knee flexion 5 5  Knee extension 5 5  Ankle dorsiflexion 5 5  Ankle plantarflexion    Ankle inversion    Ankle eversion     (Blank rows = not tested)  FUNCTIONAL TESTS:      GAIT: Distance walked: Various clinic distances  Assistive device utilized: Single point cane and None Level of assistance: SBA Comments: Pt ambulates w/wide BOS and decreased eccentric control on RLE. When discussing gait impairments with pt, pt very agitated, stating that "obviously" he does not walk straight because he "had back surgeries".   TREATMENT DATE:  07/11/23     Supine SLR: 2 x 10 Passive hip extensor, hamstrings, piriformis stretching bil: 3 x 30" R and L SL hip abduction: 2 x 10 R and L Attempted bridge but felt cramp in hamstrings- deferred Prone on elbows: 3 x 30"  Prone press ups:  2x10 Lower trunk rotations; 20x  Instrument assisted soft tissue  mobilization to lumbar spine for myofascial release: 10'                                                                       Ice to lumbar spine for 15-20' Ice started 1105   PATIENT EDUCATION:  Education details: POC, eval findings, difference between massage therapist and PT, importance of exercise for recovery, nerve regeneration timeline, role of neuro PT Person educated: Patient Education method: Explanation Education comprehension: needs further education  HOME EXERCISE PROGRAM: Access Code: [email or web address] URL: [email or web address] Date: 07/04/2023 Prepared by: Lavone Nian  Exercises - Supine Lower Trunk Rotation  - 2 x daily - 7 x weekly - 10 reps - Hooklying Single Knee to Chest  - 2 x daily - 7 x weekly - 10 reps - 10 hold - Static Prone on Elbows  - 2 x daily - 7 x weekly - 2-3 reps - 30 sec hold - Supine Bridge  - 1 x daily - 7 x weekly - 2 sets - 10 reps  ASSESSMENT:  CLINICAL IMPRESSION: Pt's swelling seemed better conpared to last session in lower lumbar spine around the incision. Patient reported compliance with HEP. Pt reports no significant changes with radicular symptoms but reported that he feels "stronger", especially when moving in bed. .   OBJECTIVE IMPAIRMENTS: Abnormal gait, decreased activity tolerance, decreased balance, decreased endurance, decreased knowledge of condition, decreased knowledge of use of DME, decreased mobility, difficulty walking, decreased ROM, decreased strength, decreased safety awareness, impaired sensation, and improper body mechanics.   ACTIVITY LIMITATIONS: carrying, lifting, bending, squatting, transfers, and locomotion level  PARTICIPATION LIMITATIONS: cleaning, shopping, community activity, and yard work  PERSONAL FACTORS: Behavior pattern, Fitness, Past/current experiences, and 1 comorbidity: Recent L5 decompression  are also affecting patient's  functional outcome.   REHAB POTENTIAL: Fair due to poor compliance w/PT and agitation  CLINICAL DECISION MAKING: Stable/uncomplicated  EVALUATION COMPLEXITY: Low   GOALS: Goals reviewed with patient? Yes  STG = LTG due to POC length     LONG TERM GOALS: Target date: 09/19/2023    Pt will be independent with HEP for improved strength, balance, transfers and gait.  Baseline: not established on eval  Goal status: INITIAL  2.  Pt will improve 5 x STS to less than or equal to 20 seconds w/improved body mechanics to demonstrate improved functional strength and transfer efficiency.   Baseline: 24.56s w/poor body mechanics  Goal status: INITIAL   PLAN:  PT FREQUENCY: 1x/week  PT DURATION: 10 weeks  PLANNED INTERVENTIONS: 97164- PT Re-evaluation, 97110-Therapeutic exercises, 97530- Therapeutic activity, 97112- Neuromuscular re-education, 97535- Self Care, 21308- Manual therapy, [phone]- Gait training, [phone]- Aquatic Therapy, [phone]- Electrical stimulation (manual), Patient/Family education, Balance training, Dry Needling, Joint mobilization, Spinal manipulation, Spinal mobilization, Scar mobilization, DME instructions, Cryotherapy, and Moist heat.  PLAN FOR NEXT SESSION: Pt only wants to work on deep tissue massage and saddle region numbness. Establish HEP for deep tissue techniques he can do at home. Pt needs to work on functional strength of RLE but is declining at eval.    Ileana Ladd, PT, DPT 07/11/2023, 11:09 AM

## 2023-07-18 ENCOUNTER — Ambulatory Visit: Payer: Medicare PPO | Attending: Orthopedic Surgery

## 2023-07-18 DIAGNOSIS — M5431 Sciatica, right side: Secondary | ICD-10-CM

## 2023-07-18 DIAGNOSIS — M79604 Pain in right leg: Secondary | ICD-10-CM

## 2023-07-18 DIAGNOSIS — M5459 Other low back pain: Secondary | ICD-10-CM | POA: Diagnosis present

## 2023-07-18 DIAGNOSIS — R2681 Unsteadiness on feet: Secondary | ICD-10-CM | POA: Diagnosis present

## 2023-07-18 DIAGNOSIS — M6281 Muscle weakness (generalized): Secondary | ICD-10-CM

## 2023-07-18 NOTE — Therapy (Signed)
 OUTPATIENT PHYSICAL THERAPY THORACOLUMBAR EVALUATION   Patient Name: [name] [name] MRN: [id number] DOB:[birth year], 63 y.o., male Today's Date: 07/18/2023  END OF SESSION:  PT End of Session - 07/18/23 1146     Visit Number 4    Number of Visits 13   plus eval   Date for PT Re-Evaluation 09/19/23    Authorization Type [employer]    PT Start Time 1015    PT Stop Time 1100    PT Time Calculation (min) 45 min    Activity Tolerance Patient tolerated treatment well    Behavior During Therapy Agitated              Past Medical History:  Diagnosis Date   Arthritis    osteoarthritis   Environmental allergies    External hemorrhoid    Heart murmur       in 9th grade asymptomatic   Hypertension    Past Surgical History:  Procedure Laterality Date   APPENDECTOMY  08-20-2009   DX LAPAROSCOPY /  LYSIS ADHESIONS/  OPEN ILEOCECAL RESECTION  08-28-2009   cecal perforation   EVALUATION UNDER ANESTHESIA WITH HEMORRHOIDECTOMY N/A 03/12/2015   Procedure: EXAM UNDER ANESTHESIA WITH HEMORRHOIDECTOMY;  Surgeon: Lynda Leos, MD;  Location: [facility];  Service: General;  Laterality: N/A;   KNEE ARTHROSCOPY W/ MENISCECTOMY Right 07-10-2006   KNEE CLOSED REDUCTION Right 09/24/2018   Procedure: CLOSED MANIPULATION KNEE;  Surgeon: Melodi Lerner, MD;  Location: [facility];  Service: Orthopedics;  Laterality: Right;  Negative to COVID19 screening   SYNOVECTOMY WITH POLY EXCHANGE Right 11/18/2019   Procedure: SYNOVECTOMY WITH POLY EXCHANGE;  Surgeon: Rubie Kemps, MD;  Location: [facility];  Service: Orthopedics;  Laterality: Right;   TOTAL KNEE ARTHROPLASTY Right 08/06/2018   Procedure: TOTAL KNEE ARTHROPLASTY;  Surgeon: Melodi Lerner, MD;  Location: [facility];  Service: Orthopedics;  Laterality: Right;   Patient Active Problem List   Diagnosis Date Noted   Arthrofibrosis of total knee arthroplasty, initial encounter (HCC) 08/06/2018   Osteoarthritis of right knee  08/06/2018   History of resection of terminal ileum 06/28/2018   ETOH abuse 06/28/2018   Leukopenia 06/25/2018   Abdominal hernia 04/25/2013    PCP: Not in system   REFERRING PROVIDER: Passias, Maude Colander, MD  REFERRING DIAG:  M51.16 (ICD-10-CM) - Intervertebral disc disorders with radiculopathy, lumbar region    Rationale for Evaluation and Treatment: Rehabilitation  THERAPY DIAG:  Unsteadiness on feet  Muscle weakness (generalized)  Sciatica, right side  Pain in right leg  ONSET DATE: 06/26/2023 (referral)   SUBJECTIVE:  SUBJECTIVE STATEMENT: Pt reports no change in symptoms. Pt reports he feels like he is walking on gravel right now in both of his feet. He has been doing exercises everyday. No pain right now.  PERTINENT HISTORY:  resection of terminal ileum, ETOH abuse, heart murmur, R TKA (Feb 2020).  PAIN:  Are you having pain? No  PRECAUTIONS: Back and Fall  RED FLAGS: Numbness in saddle region     WEIGHT BEARING RESTRICTIONS: No  FALLS:  Has patient fallen in last 6 months? No  LIVING ENVIRONMENT: Lives with: lives with their spouse Lives in: House/apartment Stairs: Yes: External: 3 steps; on left going up Has following equipment at home: Single point cane  OCCUPATION: Not employed   PLOF: Independent  PATIENT GOALS: I already told you that if you would listen to what I said   NEXT MD VISIT: 08/11/23  OBJECTIVE:  Note: Objective measures were completed at Evaluation unless otherwise noted.  DIAGNOSTIC FINDINGS:  MRI of Lumbar spine from 03/16/23 (pre-surgery)   IMPRESSION: 1. Broad-based central/right subarticular zone protrusion and mild facet arthropathy at L5-S1 resulting in right subarticular zone narrowing which may affect the traversing right S1 nerve  root. Correlate with radicular symptoms. 2. Moderate right worse than left facet arthropathy at L4-L5 with trace perifacetal soft tissue edema which may reflect a source of pain.  COGNITION: Overall cognitive status: Difficulty to assess due to: no family present     SENSATION: Numbness of saddle region, reports bilateral feet feels like concrete    POSTURE: rounded shoulders and forward head   LUMBAR ROM: not assessed as pt reports this is not important   AROM eval  Flexion   Extension   Right lateral flexion   Left lateral flexion   Right rotation   Left rotation    (Blank rows = not tested)  LOWER EXTREMITY ROM:     Active  Right eval Left eval  Hip flexion    Hip extension    Hip abduction    Hip adduction    Hip internal rotation    Hip external rotation    Knee flexion    Knee extension -15   Ankle dorsiflexion    Ankle plantarflexion    Ankle inversion    Ankle eversion     (Blank rows = not tested)  LOWER EXTREMITY MMT:  Tested in seated position   MMT Right eval Left eval  Hip flexion 4+ 5  Hip extension    Hip abduction 5 5  Hip adduction 5 5  Hip internal rotation    Hip external rotation    Knee flexion 5 5  Knee extension 5 5  Ankle dorsiflexion 5 5  Ankle plantarflexion    Ankle inversion    Ankle eversion     (Blank rows = not tested)  FUNCTIONAL TESTS:      GAIT: Distance walked: Various clinic distances  Assistive device utilized: Single point cane and None Level of assistance: SBA Comments: Pt ambulates w/wide BOS and decreased eccentric control on RLE. When discussing gait impairments with pt, pt very agitated, stating that obviously he does not walk straight because he had back surgeries.   TREATMENT DATE:  07/18/23 Manual therapy: - STM to lumbar spine, lumbar paraspinalis, multifidus and quadratus lumborum - STM to R piriformis and glut med  TherEx: Prone hip extensions: 2 x 10 R and L  Passive R knee flexion:  3 x 30 R only SL manually resisted clamshells: 3 x 10  R and L SL hip abduction: 2 x 10 R and L Supine hooklying isometric alternating hip abduction/adduction against PT's resistance: 10 x 10 holds each Supine bridge: 2 x 10 Pt educated on dermatomes and educated which dermatomes he is feeling symptoms (S2, S3, L5) primarily Educated on goal of centralization of symptoms     PATIENT EDUCATION:  Education details: POC, eval findings, difference between massage therapist and PT, importance of exercise for recovery, nerve regeneration timeline, role of neuro PT Person educated: Patient Education method: Explanation Education comprehension: needs further education  HOME EXERCISE PROGRAM: Access Code: [email or web address] URL: [email or web address] Date: 07/04/2023 Prepared by: Raj Blanch  Exercises - Supine Lower Trunk Rotation  - 2 x daily - 7 x weekly - 10 reps - Hooklying Single Knee to Chest  - 2 x daily - 7 x weekly - 10 reps - 10 hold - Static Prone on Elbows  - 2 x daily - 7 x weekly - 2-3 reps - 30 sec hold - Supine Bridge  - 1 x daily - 7 x weekly - 2 sets - 10 reps  ASSESSMENT:  CLINICAL IMPRESSION: Pt reported mild improvement in her LE symptoms in R leg after today's session.    OBJECTIVE IMPAIRMENTS: Abnormal gait, decreased activity tolerance, decreased balance, decreased endurance, decreased knowledge of condition, decreased knowledge of use of DME, decreased mobility, difficulty walking, decreased ROM, decreased strength, decreased safety awareness, impaired sensation, and improper body mechanics.   ACTIVITY LIMITATIONS: carrying, lifting, bending, squatting, transfers, and locomotion level  PARTICIPATION LIMITATIONS: cleaning, shopping, community activity, and yard work  PERSONAL FACTORS: Behavior pattern, Fitness, Past/current experiences, and 1 comorbidity: Recent L5 decompression  are also affecting patient's functional outcome.   REHAB POTENTIAL: Fair  due to poor compliance w/PT and agitation  CLINICAL DECISION MAKING: Stable/uncomplicated  EVALUATION COMPLEXITY: Low   GOALS: Goals reviewed with patient? Yes  STG = LTG due to POC length     LONG TERM GOALS: Target date: 09/19/2023    Pt will be independent with HEP for improved strength, balance, transfers and gait.  Baseline: not established on eval  Goal status: INITIAL  2.  Pt will improve 5 x STS to less than or equal to 20 seconds w/improved body mechanics to demonstrate improved functional strength and transfer efficiency.   Baseline: 24.56s w/poor body mechanics  Goal status: INITIAL   PLAN:  PT FREQUENCY: 1x/week  PT DURATION: 10 weeks  PLANNED INTERVENTIONS: 97164- PT Re-evaluation, 97110-Therapeutic exercises, 97530- Therapeutic activity, 97112- Neuromuscular re-education, 97535- Self Care, 02859- Manual therapy, [phone]- Gait training, [phone]- Aquatic Therapy, [phone]- Electrical stimulation (manual), Patient/Family education, Balance training, Dry Needling, Joint mobilization, Spinal manipulation, Spinal mobilization, Scar mobilization, DME instructions, Cryotherapy, and Moist heat.  PLAN FOR NEXT SESSION: Continue with SI/Lumbar self mobilization with isometric hip abduction/adduction; trial pelvis rotation in SL position next session.   Raj LOISE Blanch, PT, DPT 07/18/2023, 11:46 AM

## 2023-07-25 ENCOUNTER — Ambulatory Visit: Payer: Medicare PPO

## 2023-07-25 DIAGNOSIS — M5459 Other low back pain: Secondary | ICD-10-CM

## 2023-07-25 DIAGNOSIS — R2681 Unsteadiness on feet: Secondary | ICD-10-CM | POA: Diagnosis not present

## 2023-07-25 DIAGNOSIS — M6281 Muscle weakness (generalized): Secondary | ICD-10-CM

## 2023-07-25 DIAGNOSIS — M5431 Sciatica, right side: Secondary | ICD-10-CM

## 2023-07-25 DIAGNOSIS — M79604 Pain in right leg: Secondary | ICD-10-CM

## 2023-07-25 NOTE — Therapy (Signed)
OUTPATIENT PHYSICAL THERAPY THORACOLUMBAR TREATMENT NOTE   Patient Name: [name] [name] MRN: [id number] DOB:24-Mar-1961, 63 y.o., male Today's Date: 07/25/2023  END OF SESSION:  PT End of Session - 07/25/23 1128     Visit Number 5    Number of Visits 13   plus eval   Date for PT Re-Evaluation 09/19/23    Authorization Type [employer]    PT Start Time 1025    PT Stop Time 1125    PT Time Calculation (min) 60 min    Activity Tolerance Patient tolerated treatment well    Behavior During Therapy Agitated              Past Medical History:  Diagnosis Date   Arthritis    osteoarthritis   Environmental allergies    External hemorrhoid    Heart murmur       in 9th grade asymptomatic   Hypertension    Past Surgical History:  Procedure Laterality Date   APPENDECTOMY  08-20-2009   DX LAPAROSCOPY /  LYSIS ADHESIONS/  OPEN ILEOCECAL RESECTION  08-28-2009   cecal perforation   EVALUATION UNDER ANESTHESIA WITH HEMORRHOIDECTOMY N/A 03/12/2015   Procedure: EXAM UNDER ANESTHESIA WITH HEMORRHOIDECTOMY;  Surgeon: Axel Filler, MD;  Location: [facility] [place];  Service: General;  Laterality: N/A;   KNEE ARTHROSCOPY W/ MENISCECTOMY Right 07-10-2006   KNEE CLOSED REDUCTION Right 09/24/2018   Procedure: CLOSED MANIPULATION KNEE;  Surgeon: Ollen Gross, MD;  Location: [facility];  Service: Orthopedics;  Laterality: Right;  Negative to COVID19 screening   SYNOVECTOMY WITH POLY EXCHANGE Right 11/18/2019   Procedure: SYNOVECTOMY WITH POLY EXCHANGE;  Surgeon: Dannielle Huh, MD;  Location: [facility];  Service: Orthopedics;  Laterality: Right;   TOTAL KNEE ARTHROPLASTY Right 08/06/2018   Procedure: TOTAL KNEE ARTHROPLASTY;  Surgeon: Ollen Gross, MD;  Location: [facility];  Service: Orthopedics;  Laterality: Right;   Patient Active Problem List   Diagnosis Date Noted   Arthrofibrosis of total knee arthroplasty, initial encounter (HCC) 08/06/2018   Osteoarthritis of right knee  08/06/2018   History of resection of terminal ileum 06/28/2018   ETOH abuse 06/28/2018   Leukopenia 06/25/2018   Abdominal hernia 04/25/2013    PCP: Not in system   REFERRING PROVIDER: Passias, Elease Hashimoto, MD  REFERRING DIAG:  M51.16 (ICD-10-CM) - Intervertebral disc disorders with radiculopathy, lumbar region    Rationale for Evaluation and Treatment: Rehabilitation  THERAPY DIAG:  Unsteadiness on feet  Muscle weakness (generalized)  Sciatica, right side  Pain in right leg  Other low back pain  ONSET DATE: 06/26/2023 (referral)   SUBJECTIVE:  SUBJECTIVE STATEMENT: Pt reports no change in symptoms. Pt reports knee is bothering some.  PERTINENT HISTORY:  resection of terminal ileum, ETOH abuse, heart murmur, R TKA (Feb 2020).  PAIN:  Are you having pain? No  PRECAUTIONS: Back and Fall  RED FLAGS: Numbness in saddle region     WEIGHT BEARING RESTRICTIONS: No  FALLS:  Has patient fallen in last 6 months? No  LIVING ENVIRONMENT: Lives with: lives with their spouse Lives in: House/apartment Stairs: Yes: External: 3 steps; on left going up Has following equipment at home: Single point cane  OCCUPATION: Not employed   PLOF: Independent  PATIENT GOALS: "I already told you that if you would listen to what I said"   NEXT MD VISIT: 08/11/23  OBJECTIVE:  Note: Objective measures were completed at Evaluation unless otherwise noted.  DIAGNOSTIC FINDINGS:  MRI of Lumbar spine from 03/16/23 (pre-surgery)   IMPRESSION: 1. Broad-based central/right subarticular zone protrusion and mild facet arthropathy at L5-S1 resulting in right subarticular zone narrowing which may affect the traversing right S1 nerve root. Correlate with radicular symptoms. 2. Moderate right worse than left  facet arthropathy at L4-L5 with trace perifacetal soft tissue edema which may reflect a source of pain.  COGNITION: Overall cognitive status: Difficulty to assess due to: no family present     SENSATION: Numbness of saddle region, reports bilateral feet feels like concrete    POSTURE: rounded shoulders and forward head   LUMBAR ROM: not assessed as pt reports this is not important   AROM eval  Flexion   Extension   Right lateral flexion   Left lateral flexion   Right rotation   Left rotation    (Blank rows = not tested)  LOWER EXTREMITY ROM:     Active  Right eval Left eval  Hip flexion    Hip extension    Hip abduction    Hip adduction    Hip internal rotation    Hip external rotation    Knee flexion    Knee extension -15   Ankle dorsiflexion    Ankle plantarflexion    Ankle inversion    Ankle eversion     (Blank rows = not tested)  LOWER EXTREMITY MMT:  Tested in seated position   MMT Right eval Left eval  Hip flexion 4+ 5  Hip extension    Hip abduction 5 5  Hip adduction 5 5  Hip internal rotation    Hip external rotation    Knee flexion 5 5  Knee extension 5 5  Ankle dorsiflexion 5 5  Ankle plantarflexion    Ankle inversion    Ankle eversion     (Blank rows = not tested)  FUNCTIONAL TESTS:      GAIT: Distance walked: Various clinic distances  Assistive device utilized: Single point cane and None Level of assistance: SBA Comments: Pt ambulates w/wide BOS and decreased eccentric control on RLE. When discussing gait impairments with pt, pt very agitated, stating that "obviously" he does not walk straight because he "had back surgeries".   TREATMENT DATE:  07/25/23 Manual therapy: - STM to lumbar spine, lumbar paraspinalis, multifidus and quadratus lumborum bil - more emphasis placed with deeper tissue mobilization through lower lumbar spine and sacral sulcus region - Also performed some effleurage to help with some superficial swelling  still present in lumbar spine - Scar tissue mobilization in lumbar spine - grade IV PA mobilization in L3-5, during mobilization cavitation was heard and patient reported pain going  down to his R buttocks. - lower trunk rotation performed 5x pt reported improved soft tissue mobilization - pt educated to sit in firm chair to maintain upright posture and avoid sitting in couch for long duration if he is feeling symptoms in his back or buttocks. - STM to R piriformis and glut med was performed to help alleviate some of the soreness. - Pt educated on taking some mm relaxants if he has any for next 2-3 days if he feels like he need them. Pt was educated that he may feel increased soreness in his lumbar spine for next 2-3 days after deep tissue soft tissue mobilization performed today. Patient was educated on centralization of symptoms to see if he feels lesser symptoms in his feet after today's session. It may take 2-3 days before he starts to feel improvement in his symptoms once soreness in his back improves. Pt was asked to call office to update on his symptoms.     PATIENT EDUCATION:  Education details: POC, eval findings, difference between massage therapist and PT, importance of exercise for recovery, nerve regeneration timeline, role of neuro PT Person educated: Patient Education method: Explanation Education comprehension: needs further education  HOME EXERCISE PROGRAM: Access Code: [email or web address] URL: [email or web address] Date: 07/04/2023 Prepared by: Lavone Nian  Exercises - Supine Lower Trunk Rotation  - 2 x daily - 7 x weekly - 10 reps - Hooklying Single Knee to Chest  - 2 x daily - 7 x weekly - 10 reps - 10 hold - Static Prone on Elbows  - 2 x daily - 7 x weekly - 2-3 reps - 30 sec hold - Supine Bridge  - 1 x daily - 7 x weekly - 2 sets - 10 reps  ASSESSMENT:  CLINICAL IMPRESSION: Pt reported improved flexibility in lumbar spine at end of the session with lower trunk  rotation.       OBJECTIVE IMPAIRMENTS: Abnormal gait, decreased activity tolerance, decreased balance, decreased endurance, decreased knowledge of condition, decreased knowledge of use of DME, decreased mobility, difficulty walking, decreased ROM, decreased strength, decreased safety awareness, impaired sensation, and improper body mechanics.   ACTIVITY LIMITATIONS: carrying, lifting, bending, squatting, transfers, and locomotion level  PARTICIPATION LIMITATIONS: cleaning, shopping, community activity, and yard work  PERSONAL FACTORS: Behavior pattern, Fitness, Past/current experiences, and 1 comorbidity: Recent L5 decompression  are also affecting patient's functional outcome.   REHAB POTENTIAL: Fair due to poor compliance w/PT and agitation  CLINICAL DECISION MAKING: Stable/uncomplicated  EVALUATION COMPLEXITY: Low   GOALS: Goals reviewed with patient? Yes  STG = LTG due to POC length     LONG TERM GOALS: Target date: 09/19/2023    Pt will be independent with HEP for improved strength, balance, transfers and gait.  Baseline: not established on eval  Goal status: INITIAL  2.  Pt will improve 5 x STS to less than or equal to 20 seconds w/improved body mechanics to demonstrate improved functional strength and transfer efficiency.   Baseline: 24.56s w/poor body mechanics  Goal status: INITIAL   PLAN:  PT FREQUENCY: 1x/week  PT DURATION: 10 weeks  PLANNED INTERVENTIONS: 97164- PT Re-evaluation, 97110-Therapeutic exercises, 97530- Therapeutic activity, 97112- Neuromuscular re-education, 97535- Self Care, 16109- Manual therapy, [phone]- Gait training, [phone]- Aquatic Therapy, [phone]- Electrical stimulation (manual), Patient/Family education, Balance training, Dry Needling, Joint mobilization, Spinal manipulation, Spinal mobilization, Scar mobilization, DME instructions, Cryotherapy, and Moist heat.  PLAN FOR NEXT SESSION: Continue with SI/Lumbar self mobilization with  isometric hip abduction/adduction; trial pelvis rotation in  SL position next session.   Ileana Ladd, PT, DPT 07/25/2023, 11:29 AM

## 2023-08-01 ENCOUNTER — Ambulatory Visit: Payer: Medicare PPO

## 2023-08-01 DIAGNOSIS — M5459 Other low back pain: Secondary | ICD-10-CM

## 2023-08-01 DIAGNOSIS — M79604 Pain in right leg: Secondary | ICD-10-CM

## 2023-08-01 DIAGNOSIS — M6281 Muscle weakness (generalized): Secondary | ICD-10-CM

## 2023-08-01 DIAGNOSIS — M5431 Sciatica, right side: Secondary | ICD-10-CM

## 2023-08-01 DIAGNOSIS — R2681 Unsteadiness on feet: Secondary | ICD-10-CM

## 2023-08-01 NOTE — Therapy (Signed)
 OUTPATIENT PHYSICAL THERAPY THORACOLUMBAR TREATMENT NOTE   Patient Name: Dvante Hands MRN: [id number] DOB:12/15/1960, 63 y.o., male Today's Date: 08/01/2023  END OF SESSION:  PT End of Session - 08/01/23 1228     Visit Number 6    Number of Visits 13   plus eval   Date for PT Re-Evaluation 09/19/23    Authorization Type [employer]    PT Start Time 1020    PT Stop Time 1100    PT Time Calculation (min) 40 min    Activity Tolerance Patient tolerated treatment well    Behavior During Therapy Agitated              Past Medical History:  Diagnosis Date   Arthritis    osteoarthritis   Environmental allergies    External hemorrhoid    Heart murmur       in 9th grade asymptomatic   Hypertension    Past Surgical History:  Procedure Laterality Date   APPENDECTOMY  08-20-2009   DX LAPAROSCOPY /  LYSIS ADHESIONS/  OPEN ILEOCECAL RESECTION  08-28-2009   cecal perforation   EVALUATION UNDER ANESTHESIA WITH HEMORRHOIDECTOMY N/A 03/12/2015   Procedure: EXAM UNDER ANESTHESIA WITH HEMORRHOIDECTOMY;  Surgeon: Axel Filler, MD;  Location: [facility] [place];  Service: General;  Laterality: N/A;   KNEE ARTHROSCOPY W/ MENISCECTOMY Right 07-10-2006   KNEE CLOSED REDUCTION Right 09/24/2018   Procedure: CLOSED MANIPULATION KNEE;  Surgeon: Ollen Gross, MD;  Location: [facility];  Service: Orthopedics;  Laterality: Right;  Negative to COVID19 screening   SYNOVECTOMY WITH POLY EXCHANGE Right 11/18/2019   Procedure: SYNOVECTOMY WITH POLY EXCHANGE;  Surgeon: Dannielle Huh, MD;  Location: [facility];  Service: Orthopedics;  Laterality: Right;   TOTAL KNEE ARTHROPLASTY Right 08/06/2018   Procedure: TOTAL KNEE ARTHROPLASTY;  Surgeon: Ollen Gross, MD;  Location: [facility];  Service: Orthopedics;  Laterality: Right;   Patient Active Problem List   Diagnosis Date Noted   Arthrofibrosis of total knee arthroplasty, initial encounter (HCC) 08/06/2018   Osteoarthritis of right knee  08/06/2018   History of resection of terminal ileum 06/28/2018   ETOH abuse 06/28/2018   Leukopenia 06/25/2018   Abdominal hernia 04/25/2013    PCP: Not in system   REFERRING PROVIDER: Passias, Elease Hashimoto, MD  REFERRING DIAG:  M51.16 (ICD-10-CM) - Intervertebral disc disorders with radiculopathy, lumbar region    Rationale for Evaluation and Treatment: Rehabilitation  THERAPY DIAG:  Muscle weakness (generalized)  Unsteadiness on feet  Sciatica, right side  Pain in right leg  Other low back pain  ONSET DATE: 06/26/2023 (referral)   SUBJECTIVE:  SUBJECTIVE STATEMENT: Pt reports no change in symptoms. Pt reprots he was not sore after last session in his hips and back. Pt reports no adverse reactions but no change in his symptoms as well. Pt reports there seems to be break in small patch on outside of his R foot where there is numbness in heel and then it gets better and then it starts again on outside of his R foot.  PERTINENT HISTORY:  resection of terminal ileum, ETOH abuse, heart murmur, R TKA (Feb 2020).  PAIN:  Are you having pain? No  PRECAUTIONS: Back and Fall  RED FLAGS: Numbness in saddle region     WEIGHT BEARING RESTRICTIONS: No  FALLS:  Has patient fallen in last 6 months? No  LIVING ENVIRONMENT: Lives with: lives with their spouse Lives in: House/apartment Stairs: Yes: External: 3 steps; on left going up Has following equipment at home: Single point cane  OCCUPATION: Not employed   PLOF: Independent  PATIENT GOALS: "I already told you that if you would listen to what I said"   NEXT MD VISIT: 08/11/23  OBJECTIVE:  Note: Objective measures were completed at Evaluation unless otherwise noted.  DIAGNOSTIC FINDINGS:  MRI of Lumbar spine from 03/16/23 (pre-surgery)    IMPRESSION: 1. Broad-based central/right subarticular zone protrusion and mild facet arthropathy at L5-S1 resulting in right subarticular zone narrowing which may affect the traversing right S1 nerve root. Correlate with radicular symptoms. 2. Moderate right worse than left facet arthropathy at L4-L5 with trace perifacetal soft tissue edema which may reflect a source of pain.  COGNITION: Overall cognitive status: Difficulty to assess due to: no family present     SENSATION: Numbness of saddle region, reports bilateral feet feels like concrete    POSTURE: rounded shoulders and forward head   LUMBAR ROM: not assessed as pt reports this is not important   AROM eval  Flexion   Extension   Right lateral flexion   Left lateral flexion   Right rotation   Left rotation    (Blank rows = not tested)  LOWER EXTREMITY ROM:     Active  Right eval Left eval  Hip flexion    Hip extension    Hip abduction    Hip adduction    Hip internal rotation    Hip external rotation    Knee flexion    Knee extension -15   Ankle dorsiflexion    Ankle plantarflexion    Ankle inversion    Ankle eversion     (Blank rows = not tested)  LOWER EXTREMITY MMT:  Tested in seated position   MMT Right eval Left eval  Hip flexion 4+ 5  Hip extension    Hip abduction 5 5  Hip adduction 5 5  Hip internal rotation    Hip external rotation    Knee flexion 5 5  Knee extension 5 5  Ankle dorsiflexion 5 5  Ankle plantarflexion    Ankle inversion    Ankle eversion     (Blank rows = not tested)  FUNCTIONAL TESTS:      GAIT: Distance walked: Various clinic distances  Assistive device utilized: Single point cane and None Level of assistance: SBA Comments: Pt ambulates w/wide BOS and decreased eccentric control on RLE. When discussing gait impairments with pt, pt very agitated, stating that "obviously" he does not walk straight because he "had back surgeries".   TREATMENT DATE:   Manually stretched toes on R foot into flexion and extension Neural flossing  with patient in supine: started with passive toes flexion/extension, ankle dorsiflexion/plantarflexion, passive SLR, hamstring stretching in supine aknd hip flexion/extension in supine all performed on R LE only Grade IV long axis R hip distraction for unilateral lumbar traction- no changes reported by patient- 3 x 30" and also performed 3 x 20 bouts of oscillations L sidelying: passive R pelvis anterior elevation and posterior depression to improve mobility in lumbar spine Soft tissue work to bil lumbar paraspinalis in sidelying position.      PATIENT EDUCATION:  Education details: POC, eval findings, difference between massage therapist and PT, importance of exercise for recovery, nerve regeneration timeline, role of neuro PT Person educated: Patient Education method: Explanation Education comprehension: needs further education  HOME EXERCISE PROGRAM: Access Code: [email or web address] URL: [email or web address] Date: 07/04/2023 Prepared by: Lavone Nian  Exercises - Supine Lower Trunk Rotation  - 2 x daily - 7 x weekly - 10 reps - Hooklying Single Knee to Chest  - 2 x daily - 7 x weekly - 10 reps - 10 hold - Static Prone on Elbows  - 2 x daily - 7 x weekly - 2-3 reps - 30 sec hold - Supine Bridge  - 1 x daily - 7 x weekly - 2 sets - 10 reps  ASSESSMENT:  CLINICAL IMPRESSION: Today's session focused on improving neural tension in R LE with passive neural flossing and improving mobility in lumbar spine. Patient is reporting overall improved mobility in lumbar spine with ADLs but overall radiculopathy symptoms remain constant.       OBJECTIVE IMPAIRMENTS: Abnormal gait, decreased activity tolerance, decreased balance, decreased endurance, decreased knowledge of condition, decreased knowledge of use of DME, decreased mobility, difficulty walking, decreased ROM, decreased strength, decreased safety  awareness, impaired sensation, and improper body mechanics.   ACTIVITY LIMITATIONS: carrying, lifting, bending, squatting, transfers, and locomotion level  PARTICIPATION LIMITATIONS: cleaning, shopping, community activity, and yard work  PERSONAL FACTORS: Behavior pattern, Fitness, Past/current experiences, and 1 comorbidity: Recent L5 decompression  are also affecting patient's functional outcome.   REHAB POTENTIAL: Fair due to poor compliance w/PT and agitation  CLINICAL DECISION MAKING: Stable/uncomplicated  EVALUATION COMPLEXITY: Low   GOALS: Goals reviewed with patient? Yes  STG = LTG due to POC length     LONG TERM GOALS: Target date: 09/19/2023    Pt will be independent with HEP for improved strength, balance, transfers and gait.  Baseline: not established on eval  Goal status: INITIAL  2.  Pt will improve 5 x STS to less than or equal to 20 seconds w/improved body mechanics to demonstrate improved functional strength and transfer efficiency.   Baseline: 24.56s w/poor body mechanics  Goal status: INITIAL   PLAN:  PT FREQUENCY: 1x/week  PT DURATION: 10 weeks  PLANNED INTERVENTIONS: 97164- PT Re-evaluation, 97110-Therapeutic exercises, 97530- Therapeutic activity, 97112- Neuromuscular re-education, 97535- Self Care, 16109- Manual therapy, [phone]- Gait training, [phone]- Aquatic Therapy, [phone]- Electrical stimulation (manual), Patient/Family education, Balance training, Dry Needling, Joint mobilization, Spinal manipulation, Spinal mobilization, Scar mobilization, DME instructions, Cryotherapy, and Moist heat.  PLAN FOR NEXT SESSION: Continue with SI/Lumbar self mobilization with isometric hip abduction/adduction; trial pelvis rotation in SL position next session.   Ileana Ladd, PT, DPT 08/01/2023, 12:28 PM

## 2023-08-08 ENCOUNTER — Ambulatory Visit: Payer: Medicare PPO

## 2023-08-08 DIAGNOSIS — M5459 Other low back pain: Secondary | ICD-10-CM

## 2023-08-08 DIAGNOSIS — R2681 Unsteadiness on feet: Secondary | ICD-10-CM

## 2023-08-08 DIAGNOSIS — M6281 Muscle weakness (generalized): Secondary | ICD-10-CM

## 2023-08-08 DIAGNOSIS — M79604 Pain in right leg: Secondary | ICD-10-CM

## 2023-08-08 DIAGNOSIS — M5431 Sciatica, right side: Secondary | ICD-10-CM

## 2023-08-08 NOTE — Therapy (Signed)
 OUTPATIENT PHYSICAL THERAPY THORACOLUMBAR TREATMENT NOTE   Patient Name: [name] [name] MRN: [id number] DOB:[birth year], 63 y.o., male Today's Date: 08/08/2023  END OF SESSION:  PT End of Session - 08/08/23 1022     Visit Number 7    Number of Visits 13   plus eval   Date for PT Re-Evaluation 09/19/23    Authorization Type [employer]    PT Start Time 1022    PT Stop Time 1102    PT Time Calculation (min) 40 min    Activity Tolerance Patient tolerated treatment well    Behavior During Therapy Agitated              Past Medical History:  Diagnosis Date   Arthritis    osteoarthritis   Environmental allergies    External hemorrhoid    Heart murmur       in 9th grade asymptomatic   Hypertension    Past Surgical History:  Procedure Laterality Date   APPENDECTOMY  08-20-2009   DX LAPAROSCOPY /  LYSIS ADHESIONS/  OPEN ILEOCECAL RESECTION  08-28-2009   cecal perforation   EVALUATION UNDER ANESTHESIA WITH HEMORRHOIDECTOMY N/A 03/12/2015   Procedure: EXAM UNDER ANESTHESIA WITH HEMORRHOIDECTOMY;  Surgeon: Axel Filler, MD;  Location: [facility] [place];  Service: General;  Laterality: N/A;   KNEE ARTHROSCOPY W/ MENISCECTOMY Right 07-10-2006   KNEE CLOSED REDUCTION Right 09/24/2018   Procedure: CLOSED MANIPULATION KNEE;  Surgeon: Ollen Gross, MD;  Location: [facility];  Service: Orthopedics;  Laterality: Right;  Negative to COVID19 screening   SYNOVECTOMY WITH POLY EXCHANGE Right 11/18/2019   Procedure: SYNOVECTOMY WITH POLY EXCHANGE;  Surgeon: Dannielle Huh, MD;  Location: [facility];  Service: Orthopedics;  Laterality: Right;   TOTAL KNEE ARTHROPLASTY Right 08/06/2018   Procedure: TOTAL KNEE ARTHROPLASTY;  Surgeon: Ollen Gross, MD;  Location: [facility];  Service: Orthopedics;  Laterality: Right;   Patient Active Problem List   Diagnosis Date Noted   Arthrofibrosis of total knee arthroplasty, initial encounter (HCC) 08/06/2018   Osteoarthritis of right knee  08/06/2018   History of resection of terminal ileum 06/28/2018   ETOH abuse 06/28/2018   Leukopenia 06/25/2018   Abdominal hernia 04/25/2013    PCP: Not in system   REFERRING PROVIDER: Passias, Elease Hashimoto, MD  REFERRING DIAG:  M51.16 (ICD-10-CM) - Intervertebral disc disorders with radiculopathy, lumbar region    Rationale for Evaluation and Treatment: Rehabilitation  THERAPY DIAG:  Muscle weakness (generalized)  Unsteadiness on feet  Sciatica, right side  Pain in right leg  Other low back pain  ONSET DATE: 06/26/2023 (referral)   SUBJECTIVE:  SUBJECTIVE STATEMENT: Symptoms are the same. He tried intercourse and it was painful around his scrotum. He has MD appt this afternoon with video call and sees them in person on Friday.Marland Kitchen PERTINENT HISTORY:  resection of terminal ileum, ETOH abuse, heart murmur, R TKA (Feb 2020).  PAIN:  Are you having pain? No  PRECAUTIONS: Back and Fall  RED FLAGS: Numbness in saddle region     WEIGHT BEARING RESTRICTIONS: No  FALLS:  Has patient fallen in last 6 months? No  LIVING ENVIRONMENT: Lives with: lives with their spouse Lives in: House/apartment Stairs: Yes: External: 3 steps; on left going up Has following equipment at home: Single point cane  OCCUPATION: Not employed   PLOF: Independent  PATIENT GOALS: "I already told you that if you would listen to what I said"   NEXT MD VISIT: 08/11/23  OBJECTIVE:  Note: Objective measures were completed at Evaluation unless otherwise noted.  DIAGNOSTIC FINDINGS:  MRI of Lumbar spine from 03/16/23 (pre-surgery)   IMPRESSION: 1. Broad-based central/right subarticular zone protrusion and mild facet arthropathy at L5-S1 resulting in right subarticular zone narrowing which may affect the traversing  right S1 nerve root. Correlate with radicular symptoms. 2. Moderate right worse than left facet arthropathy at L4-L5 with trace perifacetal soft tissue edema which may reflect a source of pain.  COGNITION: Overall cognitive status: Difficulty to assess due to: no family present     SENSATION: Numbness of saddle region, reports bilateral feet feels like concrete    POSTURE: rounded shoulders and forward head   LUMBAR ROM: not assessed as pt reports this is not important   AROM eval  Flexion   Extension   Right lateral flexion   Left lateral flexion   Right rotation   Left rotation    (Blank rows = not tested)  LOWER EXTREMITY ROM:     Active  Right eval Left eval  Hip flexion    Hip extension    Hip abduction    Hip adduction    Hip internal rotation    Hip external rotation    Knee flexion    Knee extension -15   Ankle dorsiflexion    Ankle plantarflexion    Ankle inversion    Ankle eversion     (Blank rows = not tested)  LOWER EXTREMITY MMT:  Tested in seated position   MMT Right eval Left eval  Hip flexion 4+ 5  Hip extension    Hip abduction 5 5  Hip adduction 5 5  Hip internal rotation    Hip external rotation    Knee flexion 5 5  Knee extension 5 5  Ankle dorsiflexion 5 5  Ankle plantarflexion    Ankle inversion    Ankle eversion     (Blank rows = not tested)  FUNCTIONAL TESTS:      GAIT: Distance walked: Various clinic distances  Assistive device utilized: Single point cane and None Level of assistance: SBA Comments: Pt ambulates w/wide BOS and decreased eccentric control on RLE. When discussing gait impairments with pt, pt very agitated, stating that "obviously" he does not walk straight because he "had back surgeries".   TREATMENT DATE:  Myofascial release and soft tissue mobilization in bil lower lumbar spine Grade III transverse mobilization at L3-5 R and L Prone press ups: 2 x 10 Prone hip extensions 2 x 10 Supine hooklying  double knee to chest: 2 x 10 Supine bridge 2 x 10  Ice to lumbar spine in supine for  15' post session     PATIENT EDUCATION:  Education details: POC, eval findings, difference between massage therapist and PT, importance of exercise for recovery, nerve regeneration timeline, role of neuro PT Person educated: Patient Education method: Explanation Education comprehension: needs further education  HOME EXERCISE PROGRAM: Access Code: [email or web address] URL: [email or web address] Date: 07/04/2023 Prepared by: Lavone Nian  Exercises - Supine Lower Trunk Rotation  - 2 x daily - 7 x weekly - 10 reps - Hooklying Single Knee to Chest  - 2 x daily - 7 x weekly - 10 reps - 10 hold - Static Prone on Elbows  - 2 x daily - 7 x weekly - 2-3 reps - 30 sec hold - Supine Bridge  - 1 x daily - 7 x weekly - 2 sets - 10 reps  ASSESSMENT:  CLINICAL IMPRESSION: Pt tolerated session well. Reported improved flexibility at end of the session in lumbar spine but no change in radicular symptoms.       OBJECTIVE IMPAIRMENTS: Abnormal gait, decreased activity tolerance, decreased balance, decreased endurance, decreased knowledge of condition, decreased knowledge of use of DME, decreased mobility, difficulty walking, decreased ROM, decreased strength, decreased safety awareness, impaired sensation, and improper body mechanics.   ACTIVITY LIMITATIONS: carrying, lifting, bending, squatting, transfers, and locomotion level  PARTICIPATION LIMITATIONS: cleaning, shopping, community activity, and yard work  PERSONAL FACTORS: Behavior pattern, Fitness, Past/current experiences, and 1 comorbidity: Recent L5 decompression  are also affecting patient's functional outcome.   REHAB POTENTIAL: Fair due to poor compliance w/PT and agitation  CLINICAL DECISION MAKING: Stable/uncomplicated  EVALUATION COMPLEXITY: Low   GOALS: Goals reviewed with patient? Yes  STG = LTG due to POC length     LONG TERM  GOALS: Target date: 09/19/2023    Pt will be independent with HEP for improved strength, balance, transfers and gait.  Baseline: not established on eval  Goal status: INITIAL  2.  Pt will improve 5 x STS to less than or equal to 20 seconds w/improved body mechanics to demonstrate improved functional strength and transfer efficiency.   Baseline: 24.56s w/poor body mechanics  Goal status: INITIAL   PLAN:  PT FREQUENCY: 1x/week  PT DURATION: 10 weeks  PLANNED INTERVENTIONS: 97164- PT Re-evaluation, 97110-Therapeutic exercises, 97530- Therapeutic activity, 97112- Neuromuscular re-education, 97535- Self Care, 16109- Manual therapy, [phone]- Gait training, [phone]- Aquatic Therapy, [phone]- Electrical stimulation (manual), Patient/Family education, Balance training, Dry Needling, Joint mobilization, Spinal manipulation, Spinal mobilization, Scar mobilization, DME instructions, Cryotherapy, and Moist heat.  PLAN FOR NEXT SESSION: Continue with SI/Lumbar self mobilization with isometric hip abduction/adduction; trial pelvis rotation in SL position next session.   Ileana Ladd, PT, DPT 08/08/2023, 10:22 AM

## 2023-08-15 ENCOUNTER — Ambulatory Visit: Payer: Medicare PPO | Attending: Orthopedic Surgery

## 2023-08-15 DIAGNOSIS — R2681 Unsteadiness on feet: Secondary | ICD-10-CM | POA: Diagnosis present

## 2023-08-15 DIAGNOSIS — M5431 Sciatica, right side: Secondary | ICD-10-CM | POA: Diagnosis present

## 2023-08-15 DIAGNOSIS — M79604 Pain in right leg: Secondary | ICD-10-CM | POA: Insufficient documentation

## 2023-08-15 DIAGNOSIS — M6281 Muscle weakness (generalized): Secondary | ICD-10-CM | POA: Insufficient documentation

## 2023-08-15 DIAGNOSIS — M5459 Other low back pain: Secondary | ICD-10-CM | POA: Insufficient documentation

## 2023-08-15 NOTE — Therapy (Signed)
 OUTPATIENT PHYSICAL THERAPY THORACOLUMBAR TREATMENT NOTE  PHYSICAL THERAPY DISCHARGE SUMMARY  Visits from Start of Care: 8  Current functional level related to goals / functional outcomes: WFL   Remaining deficits: Sensory deficits in L5-S2 regions remain bilaterally   Education / Equipment: HEP   Patient agrees to discharge. Patient goals were met. Patient is being discharged due to lack of progress.   Patient Name: Taesean Reth MRN: [id number] DOB:1961/06/07, 63 y.o., male Today's Date: 08/15/2023  END OF SESSION:  PT End of Session - 08/15/23 1034     Visit Number 8    Number of Visits 13   plus eval   Date for PT Re-Evaluation 09/19/23    Authorization Type [employer]    PT Start Time 1030    PT Stop Time 1110    PT Time Calculation (min) 40 min    Activity Tolerance Patient tolerated treatment well    Behavior During Therapy Agitated              Past Medical History:  Diagnosis Date   Arthritis    osteoarthritis   Environmental allergies    External hemorrhoid    Heart murmur       in 9th grade asymptomatic   Hypertension    Past Surgical History:  Procedure Laterality Date   APPENDECTOMY  08-20-2009   DX LAPAROSCOPY /  LYSIS ADHESIONS/  OPEN ILEOCECAL RESECTION  08-28-2009   cecal perforation   EVALUATION UNDER ANESTHESIA WITH HEMORRHOIDECTOMY N/A 03/12/2015   Procedure: EXAM UNDER ANESTHESIA WITH HEMORRHOIDECTOMY;  Surgeon: Axel Filler, MD;  Location: [facility] ;  Service: General;  Laterality: N/A;   KNEE ARTHROSCOPY W/ MENISCECTOMY Right 07-10-2006   KNEE CLOSED REDUCTION Right 09/24/2018   Procedure: CLOSED MANIPULATION KNEE;  Surgeon: Ollen Gross, MD;  Location: [facility];  Service: Orthopedics;  Laterality: Right;  Negative to COVID19 screening   SYNOVECTOMY WITH POLY EXCHANGE Right 11/18/2019   Procedure: SYNOVECTOMY WITH POLY EXCHANGE;  Surgeon: Dannielle Huh, MD;  Location: [facility];  Service: Orthopedics;   Laterality: Right;   TOTAL KNEE ARTHROPLASTY Right 08/06/2018   Procedure: TOTAL KNEE ARTHROPLASTY;  Surgeon: Ollen Gross, MD;  Location: [facility];  Service: Orthopedics;  Laterality: Right;   Patient Active Problem List   Diagnosis Date Noted   Arthrofibrosis of total knee arthroplasty, initial encounter (HCC) 08/06/2018   Osteoarthritis of right knee 08/06/2018   History of resection of terminal ileum 06/28/2018   ETOH abuse 06/28/2018   Leukopenia 06/25/2018   Abdominal hernia 04/25/2013    PCP: Not in system   REFERRING PROVIDER: Passias, Elease Hashimoto, MD  REFERRING DIAG:  M51.16 (ICD-10-CM) - Intervertebral disc disorders with radiculopathy, lumbar region    Rationale for Evaluation and Treatment: Rehabilitation  THERAPY DIAG:  Muscle weakness (generalized)  Sciatica, right side  Unsteadiness on feet  Pain in right leg  Other low back pain  ONSET DATE: 06/26/2023 (referral)   SUBJECTIVE:  SUBJECTIVE STATEMENT: Symptoms are the same. MD had phone interview on 2/23 but they cancelled in person interview on 2/28 I want to get a MRI to figure out what is going on. PERTINENT HISTORY:  resection of terminal ileum, ETOH abuse, heart murmur, R TKA (Feb 2020).  PAIN:  Are you having pain? No  PRECAUTIONS: Back and Fall  RED FLAGS: Numbness in saddle region     WEIGHT BEARING RESTRICTIONS: No  FALLS:  Has patient fallen in last 6 months? No  LIVING ENVIRONMENT: Lives with: lives with their spouse Lives in: House/apartment Stairs: Yes: External: 3 steps; on left going up Has following equipment at home: Single point cane  OCCUPATION: Not employed   PLOF: Independent  PATIENT GOALS: "I already told you that if you would listen to what I said"   NEXT MD VISIT:  08/11/23  OBJECTIVE:  Note: Objective measures were completed at Evaluation unless otherwise noted.  DIAGNOSTIC FINDINGS:  MRI of Lumbar spine from 03/16/23 (pre-surgery)   IMPRESSION: 1. Broad-based central/right subarticular zone protrusion and mild facet arthropathy at L5-S1 resulting in right subarticular zone narrowing which may affect the traversing right S1 nerve root. Correlate with radicular symptoms. 2. Moderate right worse than left facet arthropathy at L4-L5 with trace perifacetal soft tissue edema which may reflect a source of pain.  COGNITION: Overall cognitive status: Difficulty to assess due to: no family present     SENSATION: Numbness of saddle region, reports bilateral feet feels like concrete    POSTURE: rounded shoulders and forward head   LUMBAR ROM: not assessed as pt reports this is not important   AROM eval  Flexion   Extension   Right lateral flexion   Left lateral flexion   Right rotation   Left rotation    (Blank rows = not tested)  LOWER EXTREMITY ROM:     Active  Right eval Left eval  Hip flexion    Hip extension    Hip abduction    Hip adduction    Hip internal rotation    Hip external rotation    Knee flexion    Knee extension -15   Ankle dorsiflexion    Ankle plantarflexion    Ankle inversion    Ankle eversion     (Blank rows = not tested)  LOWER EXTREMITY MMT:  Tested in seated position   MMT Right eval Left eval  Hip flexion 4+ 5  Hip extension    Hip abduction 5 5  Hip adduction 5 5  Hip internal rotation    Hip external rotation    Knee flexion 5 5  Knee extension 5 5  Ankle dorsiflexion 5 5  Ankle plantarflexion    Ankle inversion    Ankle eversion     (Blank rows = not tested)  FUNCTIONAL TESTS:      GAIT: Distance walked: Various clinic distances  Assistive device utilized: Single point cane and None Level of assistance: SBA Comments: Pt ambulates w/wide BOS and decreased eccentric control on  RLE. When discussing gait impairments with pt, pt very agitated, stating that "obviously" he does not walk straight because he "had back surgeries".   TREATMENT DATE:  Reviewed and performed following exercises in session as final HEP: Access Code: [email or web address] URL: [email or web address] Date: 08/15/2023 Prepared by: Lavone Nian  Exercises - Supine Lower Trunk Rotation  - 2 x daily - 7 x weekly - 10 reps - Hooklying Single Knee to Chest  - 2 x daily - 7  x weekly - 10 reps - 10 hold - Sidelying Hip Abduction  - 1 x daily - 7 x weekly - 2 sets - 10 reps - Prone Press Up  - 1-2 x daily - 7 x weekly - 10 reps - Supine Active Straight Leg Raise  - 1 x daily - 7 x weekly - 2 sets - 10 reps - Supine Bridge  - 1 x daily - 7 x weekly - 2 sets - 10 reps - Supine Double Bent Leg Lift  - 1 x daily - 7 x weekly - 2 sets - 10 reps    PATIENT EDUCATION:  Education details: POC, eval findings, difference between massage therapist and PT, importance of exercise for recovery, nerve regeneration timeline, role of neuro PT Person educated: Patient Education method: Explanation Education comprehension: needs further education  HOME EXERCISE PROGRAM: Access Code: [email or web address]- Final HEP URL: [email or web address] Date: 08/15/2023 Prepared by: Lavone Nian  Exercises - Supine Lower Trunk Rotation  - 2 x daily - 7 x weekly - 10 reps - Hooklying Single Knee to Chest  - 2 x daily - 7 x weekly - 10 reps - 10 hold - Sidelying Hip Abduction  - 1 x daily - 7 x weekly - 2 sets - 10 reps - Prone Press Up  - 1-2 x daily - 7 x weekly - 10 reps - Supine Active Straight Leg Raise  - 1 x daily - 7 x weekly - 2 sets - 10 reps - Supine Bridge  - 1 x daily - 7 x weekly - 2 sets - 10 reps - Supine Double Bent Leg Lift  - 1 x daily - 7 x weekly - 2 sets - 10 reps  ASSESSMENT:  CLINICAL IMPRESSION: Patient has been seen for total of 8 sessions. We have implemented manual therapy (soft tissue  mobilization along with joint mobilization), core/lumbar stabilization, hip strengthening and neural flossing. Patient currently demonstrates [facility] of strength in bil LE. Pt has reported no improvement in his sensory impairments in bil LE which patient's primary concern at this point. Due to lack of progress in patient's sensory impairments and pt's currently functinal level, patient is being discharged from skilled PT with independent HEP management.       OBJECTIVE IMPAIRMENTS: Abnormal gait, decreased activity tolerance, decreased balance, decreased endurance, decreased knowledge of condition, decreased knowledge of use of DME, decreased mobility, difficulty walking, decreased ROM, decreased strength, decreased safety awareness, impaired sensation, and improper body mechanics.   ACTIVITY LIMITATIONS: carrying, lifting, bending, squatting, transfers, and locomotion level  PARTICIPATION LIMITATIONS: cleaning, shopping, community activity, and yard work  PERSONAL FACTORS: Behavior pattern, Fitness, Past/current experiences, and 1 comorbidity: Recent L5 decompression  are also affecting patient's functional outcome.   REHAB POTENTIAL: Fair due to poor compliance w/PT and agitation  CLINICAL DECISION MAKING: Stable/uncomplicated  EVALUATION COMPLEXITY: Low   GOALS: Goals reviewed with patient? Yes  STG = LTG due to POC length     LONG TERM GOALS: Target date: 09/19/2023    Pt will be independent with HEP for improved strength, balance, transfers and gait.  Baseline: not established on eval  Goal status: Goal met  2.  Pt will improve 5 x STS to less than or equal to 20 seconds w/improved body mechanics to demonstrate improved functional strength and transfer efficiency.   Baseline: 24.56s w/poor body mechanics ; 20 seconds without UE support Goal status: Goal met   PLAN: Discharge from skilled PT    Phoebe Sharps  Allena Katz, PT, DPT 08/15/2023, 11:07 AM

## 2023-08-22 ENCOUNTER — Ambulatory Visit: Payer: Medicare PPO

## 2023-08-29 ENCOUNTER — Ambulatory Visit: Payer: Medicare PPO

## 2023-09-05 ENCOUNTER — Ambulatory Visit: Payer: Medicare PPO
